# Patient Record
Sex: Male | Born: 1935 | Race: White | Hispanic: No | Marital: Married | Smoking: Never smoker
Health system: Southern US, Community
[De-identification: ages and names within clinical notes are randomized; demographics above are authoritative.]

## PROBLEM LIST (undated history)

## (undated) DIAGNOSIS — I639 Cerebral infarction, unspecified: Secondary | ICD-10-CM

## (undated) DIAGNOSIS — F329 Major depressive disorder, single episode, unspecified: Secondary | ICD-10-CM

## (undated) DIAGNOSIS — E785 Hyperlipidemia, unspecified: Secondary | ICD-10-CM

## (undated) DIAGNOSIS — F419 Anxiety disorder, unspecified: Secondary | ICD-10-CM

## (undated) DIAGNOSIS — F32A Depression, unspecified: Secondary | ICD-10-CM

## (undated) DIAGNOSIS — M8580 Other specified disorders of bone density and structure, unspecified site: Secondary | ICD-10-CM

## (undated) DIAGNOSIS — I10 Essential (primary) hypertension: Secondary | ICD-10-CM

## (undated) DIAGNOSIS — M1990 Unspecified osteoarthritis, unspecified site: Secondary | ICD-10-CM

## (undated) DIAGNOSIS — E210 Primary hyperparathyroidism: Secondary | ICD-10-CM

## (undated) DIAGNOSIS — G459 Transient cerebral ischemic attack, unspecified: Secondary | ICD-10-CM

## (undated) HISTORY — PX: TONSILLECTOMY: SUR1361

## (undated) HISTORY — DX: Essential (primary) hypertension: I10

## (undated) HISTORY — DX: Unspecified osteoarthritis, unspecified site: M19.90

## (undated) HISTORY — PX: OTHER SURGICAL HISTORY: SHX169

## (undated) HISTORY — DX: Hyperlipidemia, unspecified: E78.5

## (undated) HISTORY — PX: WRIST SURGERY: SHX841

## (undated) HISTORY — PX: JOINT REPLACEMENT: SHX530

## (undated) HISTORY — DX: Anxiety disorder, unspecified: F41.9

---

## 2001-03-31 ENCOUNTER — Encounter: Payer: Self-pay | Admitting: Orthopaedic Surgery

## 2001-03-31 ENCOUNTER — Inpatient Hospital Stay (HOSPITAL_COMMUNITY): Admission: EM | Admit: 2001-03-31 | Discharge: 2001-04-05 | Payer: Self-pay | Admitting: Emergency Medicine

## 2001-03-31 ENCOUNTER — Encounter: Payer: Self-pay | Admitting: Emergency Medicine

## 2001-04-01 ENCOUNTER — Encounter: Payer: Self-pay | Admitting: Orthopaedic Surgery

## 2001-04-02 ENCOUNTER — Encounter: Payer: Self-pay | Admitting: Orthopaedic Surgery

## 2001-04-03 ENCOUNTER — Encounter: Payer: Self-pay | Admitting: Orthopaedic Surgery

## 2001-04-04 ENCOUNTER — Encounter: Payer: Self-pay | Admitting: Orthopaedic Surgery

## 2001-04-05 ENCOUNTER — Encounter: Payer: Self-pay | Admitting: Orthopaedic Surgery

## 2008-04-29 DIAGNOSIS — C4492 Squamous cell carcinoma of skin, unspecified: Secondary | ICD-10-CM

## 2008-04-29 HISTORY — DX: Squamous cell carcinoma of skin, unspecified: C44.92

## 2009-06-25 ENCOUNTER — Inpatient Hospital Stay (HOSPITAL_COMMUNITY): Admission: EM | Admit: 2009-06-25 | Discharge: 2009-06-28 | Payer: Self-pay | Admitting: Emergency Medicine

## 2010-07-27 LAB — CBC
HCT: 40.2 % (ref 39.0–52.0)
HCT: 42 % (ref 39.0–52.0)
Hemoglobin: 14.3 g/dL (ref 13.0–17.0)
Hemoglobin: 14.6 g/dL (ref 13.0–17.0)
MCHC: 34.2 g/dL (ref 30.0–36.0)
MCHC: 34.9 g/dL (ref 30.0–36.0)
MCV: 89.2 fL (ref 78.0–100.0)
MCV: 89.4 fL (ref 78.0–100.0)
MCV: 89.7 fL (ref 78.0–100.0)
Platelets: 315 10*3/uL (ref 150–400)
RBC: 4.58 MIL/uL (ref 4.22–5.81)
WBC: 11.1 10*3/uL — ABNORMAL HIGH (ref 4.0–10.5)
WBC: 8 10*3/uL (ref 4.0–10.5)
WBC: 8.9 10*3/uL (ref 4.0–10.5)

## 2010-07-27 LAB — COMPREHENSIVE METABOLIC PANEL
ALT: 17 U/L (ref 0–53)
AST: 20 U/L (ref 0–37)
AST: 24 U/L (ref 0–37)
Albumin: 3.7 g/dL (ref 3.5–5.2)
Alkaline Phosphatase: 50 U/L (ref 39–117)
Alkaline Phosphatase: 54 U/L (ref 39–117)
BUN: 17 mg/dL (ref 6–23)
CO2: 29 mEq/L (ref 19–32)
Calcium: 10.5 mg/dL (ref 8.4–10.5)
Calcium: 9 mg/dL (ref 8.4–10.5)
Chloride: 104 mEq/L (ref 96–112)
Creatinine, Ser: 1.26 mg/dL (ref 0.4–1.5)
GFR calc Af Amer: >60 mL/min (ref >60)
GFR calc non Af Amer: >60 mL/min (ref >60)
Glucose, Bld: 101 mg/dL — ABNORMAL HIGH (ref 70–99)
Total Protein: 7.1 g/dL (ref 6.0–8.3)

## 2010-07-27 LAB — DIFFERENTIAL
Basophils Absolute: 0.1 10*3/uL (ref 0.0–0.1)
Lymphocytes Relative: 38 % (ref 12–46)
Lymphs Abs: 3 10*3/uL (ref 0.7–4.0)
Monocytes Relative: 10 % (ref 3–12)

## 2010-07-27 LAB — POCT CARDIAC MARKERS: Myoglobin, poc: 74.8 ng/mL (ref 12–200)

## 2010-07-27 LAB — PHOSPHORUS: Phosphorus: 2.8 mg/dL (ref 2.3–4.6)

## 2010-07-27 LAB — CARDIAC PANEL(CRET KIN+CKTOT+MB+TROPI)
CK, MB: 1.2 ng/mL (ref 0.3–4.0)
Relative Index: INVALID (ref 0.0–2.5)
Relative Index: INVALID (ref 0.0–2.5)
Total CK: 74 U/L (ref 7–232)

## 2010-07-27 LAB — HEMOGLOBIN AND HEMATOCRIT, BLOOD
HCT: 42.3 % (ref 39.0–52.0)
Hemoglobin: 13.4 g/dL (ref 13.0–17.0)
Hemoglobin: 14 g/dL (ref 13.0–17.0)
Hemoglobin: 14.5 g/dL (ref 13.0–17.0)

## 2010-07-27 LAB — URINALYSIS, ROUTINE W REFLEX MICROSCOPIC
Protein, ur: NEGATIVE mg/dL
Specific Gravity, Urine: 1.014 (ref 1.005–1.030)

## 2010-07-27 LAB — LIPASE, BLOOD
Lipase: 44 U/L (ref 11–59)
Lipase: 84 U/L — ABNORMAL HIGH (ref 11–59)

## 2010-07-27 LAB — TSH: TSH: 0.657 u[IU]/mL (ref 0.350–4.500)

## 2010-07-27 LAB — TROPONIN I: Troponin I: 0.01 ng/mL (ref 0.00–0.06)

## 2010-09-23 NOTE — Discharge Summary (Signed)
[facility]  Patient:    Patrick Spence, Patrick Spence Visit Number: [id number] MRN: [id number]          Service Type: MED Location: 2S [id number] Attending Physician:  Patricia Nettle Dictated by:   Dorie Rank, P.A. Admit Date:  03/31/2001                             Discharge Summary  NO REPORT. Dictated by:   Dorie Rank, P.A. Attending Physician:  Patricia Nettle. DD:  04/05/01 TD:  04/05/01 Job: [id number]

## 2010-09-23 NOTE — H&P (Signed)
[facility]  Patient:    Patrick Spence, Patrick Spence Visit Number: [id number] MRN: [id number]          Service Type: EMS Location: ED Attending Physician:  Shelba Flake Dictated by:   Ralene Bathe, P.A. Admit Date:  03/31/2001   CC:         Chales Salmon. Abigail Miyamoto, M.D.   History and Physical  CHIEF COMPLAINT:  Left wrist and left hip pain following fall.  HISTORY OF PRESENT ILLNESS:  [age]-year-old male slipped off of a ladder today around six to eight feet off the ground at his house, landing onto his left side.  He denies syncope or loss of consciousness with the episode.  He was unable to ambulate and his wife heard him call for help.  He was brought to [facility] Emergency Room, where x-rays have shown a comminuted left acetabular fracture, a left distal radius fracture and T12 compression fracture.  He denies any other head and neck, chest or abdominal pain.  He has been alert and oriented throughout transportation as well as examination in the emergency room.  PAST MEDICAL HISTORY:  Significant for hypercholesterolemia.  PAST SURGICAL HISTORY:  Tonsillectomy and adenoidectomy.  MEDICATIONS:  Zocor, unknown dose.  ALLERGIES:  No known drug allergies.  SOCIAL HISTORY:  He does not smoke.  He drinks about one alcoholic beverage a day, a glass of wine with dinner, occasionally a mixed cocktail.  He is married.  He is retired from [employer].  PRIMARY CARE PHYSICIAN:  His medical physician is Dr. Chales Salmon. Thacker with [facility].  FAMILY HISTORY:  Noncontributory.  REVIEW OF SYSTEMS:  He denies chest pain, shortness of breath or productive cough.  HEAD:  No loss of consciousness.  No visual changes.  RESPIRATORY:  No shortness of breath, productive cough or hemoptysis.  CARDIOVASCULAR:  No chest pain or orthopnea.  GI:  No nausea, vomiting, constipation, melena or bloody stools.  GU:  No dysuria or hematuria.   MUSCULOSKELETAL:  Pertinent to present illness.  PHYSICAL EXAMINATION:  GENERAL:  Well-developed, well-nourished, alert and oriented.  He is fairly comfortable surprisingly on examination.  He is comfortable as long as he is at bedrest with no movement.  NECK:  Supple.  Trachea is midline.  Neck has full range of motion.  He is nontender.  HEENT:  His head is normocephalic, nontender.  Extraocular motions are intact.  CHEST:  Clear to auscultation bilaterally.  No rales or rhonchi.  HEART:  Regular rate and rhythm.  No murmurs, gallops or rubs.  ABDOMEN:  Soft, nontender.  Positive bowel sounds in all quadrants.  He is nontender at the pubis.  EXTREMITIES:  His shoulders have full range of motion, elbows full.  His wrist on the left has obvious deformity.  He is neurovascularly intact to bilateral upper extremities.  Lower extremity evaluation shows positive pulses, neurovascularly intact grossly to both lower extremities.  He has pain with any range of motion to the left lower extremity.  Good pulses.  GU:  A Foley that was placed has no obvious hematuria.  X-RAY FINDINGS: 1. Left severely T-type comminuted displaced acetabular fracture with    protrusio. 2. Displaced left distal radius fracture. 3. Less-than-or-equal-to 10% T12 compression fracture.  IMPRESSION: 1. Left severely T-type comminuted displaced acetabular fracture with    protrusio. 2. Displaced left distal radius fracture. 3. Less-than-or-equal-to 10% T12 compression fracture.  PROCEDURES: 1. In the emergency room, patient had a hematoma block and  Dr. Patricia Nettle    reduced the left distal radius fracture with good result.  Post-reduction    films showed improved alignment. 2. Under local anesthetic and IV sedation, application of a left femoral    traction pin was placed for skeletal traction.  Patient tolerated procedure    well.  PLAN:  Overall plan will be for around three weeks of traction  followed by continued bed-to-chair transfers once the traction pin is removed.  He will probably need overall around eight weeks to three months for healing to assess his clinical symptoms in the left acetabulum.  If he is clinically symptomatic, may require a primary total hip arthroplasty at that time.  We will discuss his x-rays with Dr. Onalee Hua III.  He will probably require open reduction and internal fixation or application of an external fixator of the left distal radius.  I have also discussed his admission with Dr. Nolon Rod Monguilod of [facility] hospitalists.  He will see and follow along with [place].  We will initially evaluate him step-down unit and watch for complications.  We will PAS hose to the lower extremities. After no significant risk of bleeding complications have been determined, will need some DVT and PE prophylaxis probably in the way of Lovenox and a short course of Coumadin possibly.  Will discuss with Dr. Jamie Brookes and Dr. Noel Gerold. Dictated by:   Ralene Bathe, P.A. Attending Physician:  Shelba Flake DD:  03/31/01 TD:  04/01/01 Job: [id number]

## 2010-09-23 NOTE — Op Note (Signed)
[facility]  Patient:    Patrick Spence, Patrick Spence Visit Number: [id number] MRN: [id number]          Service Type: MED Location: 2S 4782 01 Attending Physician:  Patricia Nettle. Admit Date:  03/31/2001                             Operative Report  PREOPERATIVE DIAGNOSES: 1. Left displaced comminuted T type acetabular fracture with    protrusio. 2. left displaced distal radius fracture. 3. T12 decompression fracture.  POSTOPERATIVE DIAGNOSES: 1. Left displaced comminuted T type acetabular fracture with    protrusio. 2. Left displaced distal radius fracture. 3. T12 decompression fracture.  PROCEDURE PERFORMED: 1. Closed reduction and hematoma block left displaced distal    radius fracture. 2. Insertion of left distal femoral traction pin. 3. Setup and placement of left distal femoral skeletal traction.  SURGEON:  Patricia Nettle, M.D.  INDICATION:  The patient is a 77 year old male who fell six feet off a ladder this afternoon.  He is brought to the [facility]. Diagnoses of the above were made.  The left distal radius fracture was closed reduced under a hematoma block and a sugar tong splint placed.  Post reduction x-rays showed acceptable alignment.  A left distal femoral traction pin was placed.  The distal femur was prepped and draped in the usual sterile fashion. A 1 cm incision was made after a local anesthetic was infiltrated into the skin and the periosteum both medial and laterally.  An incision, 1 cm was made at the medial aspect 3 cm above the medial epicondyle.  Using a power drill, a large straight Steinmann pin was inserted parallel to the knee joint perpendicular to the femoral shaft.  The pin sites were dressed using Xeroform.  A traction bow was applied to this.  Distal femoral traction was setup on an orthopedic bed using two slings to suspend the leg and in light traction from the bow.  A total of 20 pounds of  traction was initially hung and a post traction x-ray will be obtained, and adjustments made from their.  DISPOSITION:  The patient is going to be admitted to the step-down unit for close pulmonary monitoring.  A medical consult was obtained.  DVT prophylaxis will be initiated.  Aggressive pulmonary toilet will be maintained. Attending Physician:  Patricia Nettle. DD:  03/31/01 TD:  04/01/01 Job: [id number]

## 2010-09-23 NOTE — Discharge Summary (Signed)
[facility]  Patient:    Patrick Spence, Patrick Spence Visit Number: [id number] MRN: [id number]          Service Type: MED Location: 2S [phone] Attending Physician:  Patricia Nettle Dictated by:   Dorie Rank, P.A.-C. Admit Date:  03/31/2001 Disc. Date: 04/05/01                             Discharge Summary  ADMITTING DIAGNOSES: 1. Left severely T-type comminuted displaced acetabular fracture with    protrusio. 2. Displaced left distal radius fracture. 3. Less than or equal to 10% T-12 compression fracture. 4. Hyperlipidemia. 5. Osteoarthritis. 6. Hard of hearing.  DISCHARGE DIAGNOSES: 1. Left severely T-type comminuted displaced acetabular fracture with    protrusion. 2. Reduced left distal radius fracture. 3. Less than or equal to 10% T-12 compression fracture. 4. Hyperlipidemia. 5. Osteoarthritis. 6. Hard of hearing.  HISTORY OF PRESENT ILLNESS:  Mr. Foiles is a pleasant 77 year old male, who flipped off a ladder on 03/31/01, which was approximately 6-8 feet off the ground at his house.  He landed on his left side.  At that time, he denied any syncope or loss of consciousness.  He was unable to ambulate, and his wife heard him call for help.  He was brought to [facility] Emergency Room where radiographs revealed a comminuted left acetabular fracture and a left distal radius fracture, and a T-12 compression fracture.  He denied any other head or neck, chest or abdominal pain symptoms.  He was alert and oriented throughout his transportation to the emergency room.  It was felt he would benefit from undergoing admission for stabilization and pain control of his fractures.  CONSULTS:  Dr. Jamie Brookes of [facility].  PROCEDURES: 1. Left distal radius reduction under hematoma block in the emergency    department. 2. Application of left femoral traction pin for skeletal traction to the left    acetabular fracture.  The surgeon was Dr. Sharolyn Douglas.   Complications none.  HOSPITAL COURSE:  The patient was admitted on the above-stated date, and the above procedures were performed without difficulty.  A medicine consult was obtained - Dr. Jamie Brookes for medical management in the hospital.  He did have some hypertension that was felt to be situational and was resolved by 04/02/01.  He was noted to have some mild anemia while in the hospital.  No transfusions were needed.  It was felt that it was due to his multiple fracture, and no obvious bleeds were noted.  He was placed in distal femur skeletal traction.  His left wrist was kept elevated and finger range of motion was encouraged for edema control.  Ice packs were placed to the left wrist and hips.  An IV was started and kept at in cava while in the hospital. He used PlexiPulse to bilateral feet for DVT prophylaxis.  He was started on Lovenox for DVT prophylaxis as well.  Initially, he was started on a clear liquid diet and was advanced to regular by 04/01/01.  He was tolerating p.o. intake throughout his hospital stay; however, was having some nausea with food.  He did have a BM while in the hospital.  He did have some constipation. He had good bowel sounds, and his abdomen was soft throughout his hospital stay.  He was placed on head rest with the head of the bed to 30 degrees maximum.  A Foley catheter was placed.  No  blood was noted in his urine.  He utilized Microbiologist and Robaxin for pain control, Reglan and Phenergan for nausea.  He utilized an Oncologist.  X-rays were obtained daily of his pelvis and found to be unchanged.  He did have some itching while in the hospital and after changed to hypoallergenic sheets, this did resolve.  Dr. Noel Gerold conferred with Dr. Hyman Hopes, and it was felt that he would benefit from undergoing transfer and evaluation by Dr. Hyman Hopes for his acetabular fracture. Discharge planning worked on the arrangements.  On 04/05/01, he was felt to be medically  and orthopedically stable for transfer to [facility].  CONDITION ON DISCHARGE:  Stable.  LABORATORY VALUES:  H&H on 03/31/01, 13.1 and 38.6; on 04/02/01, hemoglobin 10.8, hematocrit 31.0; on 04/03/01, hemoglobin 10.5, hematocrit 30.2. Coagulation studies on 03/31/01 within normal limits.  BMET on 03/31/01 were within normal limits other than a glucose of 151.  Liver function studies on 03/31/01 with a normal amylase and ALP of 34.  PSA on 04/01/01 was 1.50.  UA on 03/31/01 was within normal limits other than ketones.  EKG on 03/31/01 revealed normal sinus rhythm, was unconfirmed.  Serial radiographs performed daily in the hospital showed a widely distracted left acetabular fracture with no change.  Postreduction of left wrist fracture on 03/31/01, films revealed distal radius fracture now near anatomic alignment in a fiberglass cast.  Left knee x-rays on 03/31/01 revealed a transverse pinned and distal femoral impaction metadiaphysis.  Thoracic x-rays from 03/31/01 revealed a T-12 compression fracture less than 10%.  CT lumbar spine reconstructions revealed mild vertebral body compression fracture at T-12; no evidence of bone, retropulsion, or compromise of the spinal canal.  DISCHARGE PLANS AND MEDICATIONS:  The patient will be transferred to [facility] under the care of Dr. Christene Slates.  ACTIVITY:  Bed rest, head of bed no greater than 30 degrees, skeletal traction to the left lower extremity PlexiPulse of foot to pumps.  DISCHARGE MEDICATIONS:  1. Protonix 40 mg 1 p.o. q.d. with meals.  2. Colace 100 mg 1 p.o. b.i.d.  3. Lovenox 30 mg subcu q.12h.  4. Percocet 5 mg 1-2 p.o. q.4-6h. p.r.n. pain.  5. Robaxin 500 mg 1 p.o. q.8h. p.r.n. spasm.  6. Phenergan 12.5 IV/25 mg p.o. q.4-6h. p.r.n. nausea.  7. Reglan 10 mg 1 p.o. q.8h. p.r.n. nausea.  8. Tylenol 650 1 p.o. q.4h. p.r.n. fever.  9. Restoril 15 mg 1 p.o. q.h.s. p.r.n. 10. Dilaudid 2-4 mg IV q.4h. p.r.n. severe pain  only. 11. Benadryl 25-50 mg p.o. q.6-8h. p.r.n. itching. 12. Laxative of choice, enema of choice p.r.n. constipation.   He will need his radiographs as well as all of the reports to be transferred with him and continue his incentive spirometry.  DIET:  Regular supplemented with Ensure.  PIN CARE SITES:  Peroxide. Dictated by:   Dorie Rank, P.A.-C. Attending Physician:  Patricia Nettle. DD:  04/05/01 TD:  04/05/01 Job: [id number]

## 2010-09-23 NOTE — Procedures (Signed)
[facility]  Patient:    Patrick Spence, Patrick Spence                     MRN: [id number] Proc. Date: 11/22/99 Adm. Date:  [id number] Disc. Date: [id number] Attending:  Louie Bun CC:         Chales Salmon. Abigail Miyamoto, M.D.                           Procedure Report  PROCEDURE:  Colonoscopy.  INDICATION FOR PROCEDURE:  Family history of colon cancer in a first-degree relative.  DESCRIPTION OF PROCEDURE:  The patient was placed in the left lateral decubitus position and placed on the pulse monitor with continuous low-flow oxygen delivered by nasal cannula.  He was sedated with 100 mg IV Demerol and 10 mg IV Versed.  The Olympus video colonoscope was inserted into the rectum and advanced to the cecum, confirmed by transillumination at McBurneys point and visualization of the ileocecal valve and appendiceal orifice.  The prep was excellent.  The cecum, ascending, transverse, descending, and sigmoid colon all appeared normal with no masses, polyps, diverticula, or other mucosal abnormalities.  The rectum likewise appeared normal, and retroflex view of the anus did reveal some small internal hemorrhoids.  The colonoscope was then withdrawn and the patient returned to the recovery room in stable condition.  He tolerated the procedure well, and there were no immediate complications.  IMPRESSION:  Internal hemorrhoids, otherwise normal colonoscopy.  PLAN:  Repeat colonoscopy in five years. DD:  11/22/99 TD:  11/23/99 Job: [id number]

## 2010-11-04 ENCOUNTER — Emergency Department (HOSPITAL_COMMUNITY)
Admission: EM | Admit: 2010-11-04 | Discharge: 2010-11-04 | Disposition: A | Discharge status: Home / Self Care | Payer: Medicare Other | Attending: Emergency Medicine | Admitting: Emergency Medicine

## 2010-11-04 DIAGNOSIS — E7800 Pure hypercholesterolemia, unspecified: Secondary | ICD-10-CM | POA: Insufficient documentation

## 2010-11-04 DIAGNOSIS — R0789 Other chest pain: Secondary | ICD-10-CM | POA: Insufficient documentation

## 2010-11-04 DIAGNOSIS — I10 Essential (primary) hypertension: Secondary | ICD-10-CM | POA: Insufficient documentation

## 2010-11-04 LAB — COMPREHENSIVE METABOLIC PANEL
ALT: 14 U/L (ref 0–53)
AST: 22 U/L (ref 0–37)
Alkaline Phosphatase: 42 U/L (ref 39–117)
CO2: 26 mEq/L (ref 19–32)
Chloride: 103 mEq/L (ref 96–112)
GFR calc Af Amer: >60 mL/min (ref >60)
GFR calc non Af Amer: >60 mL/min (ref >60)
Glucose, Bld: 104 mg/dL — ABNORMAL HIGH (ref 70–99)
Potassium: 3.2 mEq/L — ABNORMAL LOW (ref 3.5–5.1)
Sodium: 135 mEq/L (ref 135–145)
Total Bilirubin: 0.4 mg/dL (ref 0.3–1.2)

## 2010-11-04 LAB — DIFFERENTIAL
Basophils Relative: 1 % (ref 0–1)
Lymphs Abs: 1.7 10*3/uL (ref 0.7–4.0)
Monocytes Absolute: 0.7 10*3/uL (ref 0.1–1.0)
Monocytes Relative: 14 % — ABNORMAL HIGH (ref 3–12)
Neutro Abs: 2.1 10*3/uL (ref 1.7–7.7)
Neutrophils Relative %: 41 % — ABNORMAL LOW (ref 43–77)

## 2010-11-04 LAB — CBC
Hemoglobin: 13.3 g/dL (ref 13.0–17.0)
MCH: 29.9 pg (ref 26.0–34.0)
MCHC: 33.6 g/dL (ref 30.0–36.0)
MCV: 89 fL (ref 78.0–100.0)
RBC: 4.45 MIL/uL (ref 4.22–5.81)

## 2010-11-04 LAB — CK TOTAL AND CKMB (NOT AT ARMC): Total CK: 113 U/L (ref 7–232)

## 2010-11-04 LAB — TROPONIN I: Troponin I: <0.3 ng/mL (ref <0.30)

## 2012-06-18 ENCOUNTER — Encounter (INDEPENDENT_AMBULATORY_CARE_PROVIDER_SITE_OTHER): Payer: Self-pay

## 2012-06-25 ENCOUNTER — Encounter (INDEPENDENT_AMBULATORY_CARE_PROVIDER_SITE_OTHER): Payer: Self-pay | Admitting: Surgery

## 2012-06-25 ENCOUNTER — Ambulatory Visit (INDEPENDENT_AMBULATORY_CARE_PROVIDER_SITE_OTHER): Payer: Medicare Other | Admitting: Surgery

## 2012-06-26 ENCOUNTER — Encounter (INDEPENDENT_AMBULATORY_CARE_PROVIDER_SITE_OTHER): Payer: Self-pay | Admitting: Surgery

## 2012-06-26 ENCOUNTER — Ambulatory Visit (INDEPENDENT_AMBULATORY_CARE_PROVIDER_SITE_OTHER): Payer: Medicare Other | Admitting: Surgery

## 2012-06-26 NOTE — Patient Instructions (Signed)
Parathyroidectomy A parathyroidectomy is surgery to remove one or more parathyroid glands. These glands produce a hormone (parathyroid hormone) that helps control the level of calcium in your body. The glands are very small, about the size of a pea. They are located in your neck, close to your thyroid gland and your Adam's apple. Most people (85%) have four parathyroid glands,some people may have one or two more than that. Hyperparathyroidism is when too much parathyroid hormone is being produced. Usually this is caused by one of the parathyroid glands becoming enlarged, but it can also be caused by more than one of the glands. Hyperparathyroidism is found during blood tests that show high calcium in the blood. Parathyroid hormone levels will also be elevated. Cancer also can cause hyperparathyroidism, but this is rare. For the most common type of hyperparathyroidism, the treatment is surgical removal of the parathyroid gland that is enlarged. For patients with kidney failure and hyperparathyroidism, other treatment will be tried before surgery is done on the parathyroid.  Many times x-ray studies are done to find out which parathyroid gland or glands is malfunctioning. The decision about the best treatment for hyperparathyroidism is between the patient, their primary doctor, an endocrinologist, and a surgeon experienced in parathyroid surgery. LET YOUR CAREGIVER KNOW ABOUT:  Any allergies.  All medications you are taking, including:  Herbs, eyedrops, over-the-counter medications and creams.  Blood thinners (anticoagulants), aspirin or other drugs that could affect blood clotting.  Use of steroids (by mouth or as creams).  Previous problems with anesthetics, including local anesthetics.  Possibility of pregnancy, if this applies.  Any history of blood clots.  Any history of bleeding or other blood problems.  Previous surgery.  Smoking history.  Other health problems. RISKS AND  COMPLICATIONS   Short-term possibilities include:  Excessive bleeding.  Pain.  Infection near the incision.  Slow healing.  Pooling of blood under the wound (hematoma).  Damage to nerves in your neck.  Blood clots.  Difficulty breathing. This is very rare. It also is almost always temporary.  Longer-term possibilities include:  Scarring.  Skin damage.  Damage to blood vessels in the area.  Need for additional surgery.  A hoarse or weak voice. This is usually temporary. It can be the result of nerve damage.  Development of hypoparathyroidism. This means you are not making enough parathyroid hormone. It is rare. If it occurs, you will need to take calcium supplements daily. BEFORE THE PROCEDURE  Sometimes the surgery is done on an outpatient basis. This means you could go home the same day as your surgery. Other times, people need to stay in the hospital overnight. Ask your surgeon what you should expect.  If your surgery will be an outpatient procedure, arrange for someone to drive you home after the surgery.  Two weeks before your surgery, stop using aspirin and non-steroidal anti-inflammatory drugs (NSAID's) for pain relief. This includes prescription drugs and over-the-counter drugs such as ibuprofen and naproxen. Also stop taking vitamin E.  If you take blood-thinners, ask your healthcare provider when you should stop taking them.  Do not eat or drink for about 8 hours before your surgery.  You might be asked to shower or wash with a special antibacterial soap before the procedure.  Arrive at least an hour before the surgery, or whenever your surgeon recommends. This will give you time to check in and fill out any needed paperwork. PROCEDURE  The preparation:  You will change into a hospital gown.  You   will be given an IV. A needle will be inserted in your arm. Medication will be able to flow directly into your body through this needle.  You might be given a  sedative to help you relax.  You will be given a drug that puts you to sleep during the surgery (general anesthetic).  The procedure:  Once you are asleep, the surgeon will make a small cut (incision) in your lower neck. Ask your surgeon where the incision will be.  The surgeon will look for the gland(s) that are not working well. Often a tissue sample from a gland is used to determine this.  Any glands that are not working well will be removed.  The surgeon will close the incision with stitches, often these are hidden under the skin. AFTER THE PROCEDURE  You will stay in a recovery area until the anesthesia has worn off. Your blood pressure and heart rate will be checked.  If your surgery was an outpatient procedure, you will go home the same day.  If you need to stay in the hospital, you will be moved to a hospital room. You will probably stay for two to three days. This will depend on how quickly you recover.  While you are in the hospital, your blood will be tested to check the calcium levels in your body. HOME CARE INSTRUCTIONS   Take any medication that your surgeon prescribes. Follow the directions carefully. Take all of the medication.  Ask your surgeon whether you can take over-the-counter medicines for pain, discomfort or fever. Do not take aspirin unless your healthcare provider says to. Aspirin increases the chances of bleeding.  Do not get the wound wet for the first few days after surgery (or until the surgeon tells you it is OK). SEEK MEDICAL CARE IF:   You notice blood or fluid leaking from the wound, or it becomes red or swollen.  You have trouble breathing.  You have trouble speaking.  You become nauseous or throw up for more than two days after the surgery.  You develop a fever of more than 100.5 F (38.1 C). SEEK IMMEDIATE MEDICAL CARE IF:   Breathing becomes more difficult.  You develop a fever of 102.0 F (38.9 C) or higher. Document Released:  07/21/2008 Document Revised: 07/17/2011 Document Reviewed: 07/21/2008 Exit[employer] Patient Information 2013 [employer].  

## 2012-06-26 NOTE — Progress Notes (Signed)
General Surgery [facility]  Chief Complaint  Patient presents with  . New Evaluation    eval hyperparathyroidism - referral from Dr. Sigmund Hazel, [email or web address]  [place] office    HISTORY: Patient is a 79 year old white male referred by his primary care physician for evaluation of hypercalcemia. Patient has a history of osteopenia. He had a significant fracture of his pelvis and left wrist 10 years ago. He has been recently noted on laboratory studies to have elevated serum calcium levels ranging from 11.3-11.5. Ionized calcium levels were elevated at 6.1-6.3. A 24-hour urine collection for calcium was elevated at 375. Intact PTH levels have ranged from 25-48. Patient is now referred for evaluation for suspected primary hyperparathyroidism.  Patient has no prior history of endocrine disease. He denies nephrolithiasis. He has had no prior surgery on the neck. There is no family history of endocrine disease.  Past Medical History  Diagnosis Date  . Arthritis   . HLD (hyperlipidemia)   . Stroke   . Hypertension   . Anxiety   . Osteoporosis      Current Outpatient Prescriptions  Medication Sig Dispense Refill  . alendronate (FOSAMAX) 70 MG tablet Take 70 mg by mouth every 7 (seven) days. Take with a full glass of water on an empty stomach.      Marland Kitchen aspirin 325 MG buffered tablet Take 325 mg by mouth daily.      . calcium-vitamin D (OSCAL WITH D) 500-200 MG-UNIT per tablet Take 1 tablet by mouth daily.      . fish oil-omega-3 fatty acids 1000 MG capsule Take 2 g by mouth daily.      Marland Kitchen glucosamine-chondroitin 500-400 MG tablet Take 1 tablet by mouth 3 (three) times daily.      Marland Kitchen losartan (COZAAR) 50 MG tablet Take 50 mg by mouth daily.      . Multiple Vitamins-Minerals (CENTRUM SILVER ADULT 50+ PO) Take by mouth.      . niacin 500 MG CR capsule Take 500 mg by mouth at bedtime.      . simvastatin (ZOCOR) 10 MG tablet Take 10 mg by mouth at bedtime.       No current  facility-administered medications for this visit.     No Known Allergies   Family History  Problem Relation Age of Onset  . Cancer Father     stomach  . Cancer Mother     lung     History   Social History  . Marital Status: Married    Spouse Name: N/A    Number of Children: N/A  . Years of Education: N/A   Social History Main Topics  . Smoking status: Never Smoker   . Smokeless tobacco: Never Used  . Alcohol Use: Yes  . Drug Use: No  . Sexually Active: None   Other Topics Concern  . None   Social History Narrative  . None     REVIEW OF SYSTEMS - PERTINENT POSITIVES ONLY: Denies tremors. Denies palpitations. Denies fatigue. Denies dysphagia.  EXAM: Filed Vitals:   06/26/12 0933  BP: 138/82  Pulse: 64  Temp: 97.5 F (36.4 C)  Resp: 20    HEENT: normocephalic; pupils equal and reactive; sclerae clear; dentition good; mucous membranes moist NECK:  No palpable nodules in the thyroid bed; symmetric on extension; no palpable anterior or posterior cervical lymphadenopathy; no supraclavicular masses; no tenderness CHEST: clear to auscultation bilaterally without rales, rhonchi, or wheezes CARDIAC: regular rate and rhythm without significant murmur;  peripheral pulses are full EXT:  non-tender without edema; no deformity NEURO: no gross focal deficits; no sign of tremor   LABORATORY RESULTS: See [facility] (CHL-Epic) for most recent results   RADIOLOGY RESULTS: See [facility] (CHL-Epic) for most recent results   IMPRESSION: #1 hypercalcemia #2 suspected primary hyperparathyroidism #3 osteopenia #4 hypertension  PLAN: I had a lengthy discussion with the patient and his wife. I suspect that he does have primary hyperparathyroidism. I would like to check a 25 hydroxy vitamin D level and we will repeat his intact PTH level in a different laboratory. Patient will be scheduled for a nuclear medicine parathyroid scan in the near future. Once these  results are available for review, I will contact the patient.  Velora Heckler, MD, FACS General & Endocrine Surgery [facility]   Visit Diagnoses: 1. Hypercalcemia     Primary Care Physician: Neldon Labella, MD

## 2012-07-01 ENCOUNTER — Telehealth (INDEPENDENT_AMBULATORY_CARE_PROVIDER_SITE_OTHER): Payer: Self-pay

## 2012-07-01 NOTE — Telephone Encounter (Signed)
Labs to Dr Gerrit Friends for review and to be scanned into epic.

## 2012-07-05 ENCOUNTER — Encounter (HOSPITAL_COMMUNITY): Payer: Self-pay

## 2012-07-05 ENCOUNTER — Encounter (HOSPITAL_COMMUNITY): Payer: Medicare Other

## 2012-07-05 ENCOUNTER — Encounter (HOSPITAL_COMMUNITY)
Admission: RE | Admit: 2012-07-05 | Discharge: 2012-07-05 | Disposition: A | Discharge status: Home / Self Care | Payer: Medicare Other | Source: Ambulatory Visit | Attending: Surgery | Admitting: Surgery

## 2012-07-05 ENCOUNTER — Encounter (HOSPITAL_COMMUNITY): Admission: RE | Admit: 2012-07-05 | Payer: Medicare Other | Source: Ambulatory Visit

## 2012-07-05 DIAGNOSIS — E213 Hyperparathyroidism, unspecified: Secondary | ICD-10-CM | POA: Insufficient documentation

## 2012-07-05 MED ORDER — TECHNETIUM TC 99M SESTAMIBI - CARDIOLITE
25.0000 | Freq: Once | INTRAVENOUS | Status: AC | PRN
  Administered 2012-07-05: 25 via INTRAVENOUS

## 2012-07-10 ENCOUNTER — Telehealth (INDEPENDENT_AMBULATORY_CARE_PROVIDER_SITE_OTHER): Payer: Self-pay | Admitting: General Surgery

## 2012-07-10 NOTE — Telephone Encounter (Signed)
Pt called to ask about the results of his scan and Dr. Ardine Eng "next step" for him.  Explained Dr. Gerrit Friends needs to review the images and he will be notified.

## 2012-07-18 ENCOUNTER — Other Ambulatory Visit (INDEPENDENT_AMBULATORY_CARE_PROVIDER_SITE_OTHER): Payer: Self-pay

## 2012-07-18 ENCOUNTER — Telehealth (INDEPENDENT_AMBULATORY_CARE_PROVIDER_SITE_OTHER): Payer: Self-pay | Admitting: Surgery

## 2012-07-18 ENCOUNTER — Telehealth (INDEPENDENT_AMBULATORY_CARE_PROVIDER_SITE_OTHER): Payer: Self-pay | Admitting: *Deleted

## 2012-07-18 ENCOUNTER — Other Ambulatory Visit (INDEPENDENT_AMBULATORY_CARE_PROVIDER_SITE_OTHER): Payer: Self-pay | Admitting: Surgery

## 2012-07-18 DIAGNOSIS — D351 Benign neoplasm of parathyroid gland: Secondary | ICD-10-CM

## 2012-07-18 DIAGNOSIS — E210 Primary hyperparathyroidism: Secondary | ICD-10-CM | POA: Insufficient documentation

## 2012-07-18 NOTE — Telephone Encounter (Signed)
Called patient with results.  Sestamibi is negative for adenoma.  Vit D is normal.  PTH is upper normal, but definitely not suppressed.  After discussion, will proceed with MRI scan of neck in hopes of identifying adenoma and allowing for minimally invasive surgery.  Velora Heckler, MD, [facility] Office: [phone]

## 2012-07-18 NOTE — Telephone Encounter (Signed)
Patient called to verify that we have all results back and is hoping to speak with Dr. Gerrit Friends regarding the plan forward.  This RN was able to call [facility] and received the PTH, intact and Vitamin D.

## 2012-07-25 ENCOUNTER — Ambulatory Visit
Admission: RE | Admit: 2012-07-25 | Discharge: 2012-07-25 | Disposition: A | Discharge status: Home / Self Care | Payer: Medicare Other | Source: Ambulatory Visit | Attending: Surgery | Admitting: Surgery

## 2012-07-25 MED ORDER — GADOBENATE DIMEGLUMINE 529 MG/ML IV SOLN
17.0000 mL | Freq: Once | INTRAVENOUS | Status: AC | PRN
  Administered 2012-07-25: 17 mL via INTRAVENOUS

## 2012-07-29 ENCOUNTER — Encounter (INDEPENDENT_AMBULATORY_CARE_PROVIDER_SITE_OTHER): Payer: Self-pay

## 2012-08-01 ENCOUNTER — Other Ambulatory Visit (INDEPENDENT_AMBULATORY_CARE_PROVIDER_SITE_OTHER): Payer: Self-pay | Admitting: Surgery

## 2012-08-01 ENCOUNTER — Telehealth (INDEPENDENT_AMBULATORY_CARE_PROVIDER_SITE_OTHER): Payer: Self-pay

## 2012-08-01 ENCOUNTER — Telehealth (INDEPENDENT_AMBULATORY_CARE_PROVIDER_SITE_OTHER): Payer: Self-pay | Admitting: Surgery

## 2012-08-01 DIAGNOSIS — E210 Primary hyperparathyroidism: Secondary | ICD-10-CM

## 2012-08-01 NOTE — Telephone Encounter (Signed)
Called results of negative MRI.  I have recommended proceeding with neck exploration and parathyroidectomy.  Patient agrees and we will schedule his surgery at [facility] in the near future.  The risks and benefits of the procedure have been discussed at length with the patient.  The patient understands the proposed procedure, potential alternative treatments, and the course of recovery to be expected.  All of the patient's questions have been answered at this time.  The patient wishes to proceed with surgery.  Velora Heckler, MD, [facility] Office: [phone]

## 2012-08-01 NOTE — Telephone Encounter (Signed)
I returned pts call. Pt given MRI result and advised the report is forwarded to Dr Gerrit Friends to review and advise any treatment plan or f/u.

## 2012-08-01 NOTE — Telephone Encounter (Signed)
Surgery scheduling form to surgery schedulers per Dr Ardine Eng request.

## 2012-08-07 ENCOUNTER — Encounter (HOSPITAL_COMMUNITY): Payer: Self-pay | Admitting: Pharmacy Technician

## 2012-08-12 ENCOUNTER — Encounter (HOSPITAL_COMMUNITY): Payer: Self-pay

## 2012-08-12 ENCOUNTER — Encounter (HOSPITAL_COMMUNITY)
Admission: RE | Admit: 2012-08-12 | Discharge: 2012-08-12 | Disposition: A | Discharge status: Home / Self Care | Payer: Medicare Other | Source: Ambulatory Visit | Attending: Surgery | Admitting: Surgery

## 2012-08-12 HISTORY — DX: Transient cerebral ischemic attack, unspecified: G45.9

## 2012-08-12 HISTORY — DX: Depression, unspecified: F32.A

## 2012-08-12 HISTORY — DX: Cerebral infarction, unspecified: I63.9

## 2012-08-12 HISTORY — DX: Primary hyperparathyroidism: E21.0

## 2012-08-12 HISTORY — DX: Other specified disorders of bone density and structure, unspecified site: M85.80

## 2012-08-12 HISTORY — DX: Major depressive disorder, single episode, unspecified: F32.9

## 2012-08-12 LAB — CBC
HCT: 39.9 % (ref 39.0–52.0)
MCH: 31 pg (ref 26.0–34.0)
MCV: 90.3 fL (ref 78.0–100.0)
Platelets: 233 10*3/uL (ref 150–400)
RBC: 4.42 MIL/uL (ref 4.22–5.81)

## 2012-08-12 LAB — BASIC METABOLIC PANEL
BUN: 20 mg/dL (ref 6–23)
CO2: 27 mEq/L (ref 19–32)
Calcium: 10.4 mg/dL (ref 8.4–10.5)
Chloride: 105 mEq/L (ref 96–112)
Creatinine, Ser: 1.01 mg/dL (ref 0.50–1.35)
Glucose, Bld: 98 mg/dL (ref 70–99)

## 2012-08-12 NOTE — Patient Instructions (Addendum)
Semaje Kinker Chimento  08/12/2012                           YOUR PROCEDURE IS SCHEDULED ON: 08/19/12               PLEASE REPORT TO SHORT STAY CENTER AT :  7:00 AM               CALL THIS NUMBER IF ANY PROBLEMS THE DAY OF SURGERY :               [phone]                      REMEMBER:   Do not eat food or drink liquids AFTER MIDNIGHT   Take these medicines the morning of surgery with A SIP OF WATER:  NONE   Do not wear jewelry, make-up   Do not wear lotions, powders, or perfumes.   Do not shave legs or underarms 12 hrs. before surgery (men may shave face)  Do not bring valuables to the hospital.  Contacts, dentures or bridgework may not be worn into surgery.  Leave suitcase in the car. After surgery it may be brought to your room.  For patients admitted to the hospital more than one night, checkout time is 11:00                          The day of discharge.   Patients discharged the day of surgery will not be allowed to drive home                             If going home same day of surgery, must have someone stay with you first                           24 hrs at home and arrange for some one to drive you home from hospital.    Special Instructions:   Please read over the following fact sheets that you were given:               1. MRSA  INFORMATION                      2. [place] PREPARING FOR SURGERY SHEET               3. STOP ASPIRIN AND HERBAL MEDS 5 DAYS PREOP                                                X_____________________________________________________________________        Failure to follow these instructions may result in cancellation of your surgery

## 2012-08-13 ENCOUNTER — Encounter (INDEPENDENT_AMBULATORY_CARE_PROVIDER_SITE_OTHER): Payer: Self-pay

## 2012-08-19 ENCOUNTER — Encounter (HOSPITAL_COMMUNITY): Payer: Self-pay | Admitting: Anesthesiology

## 2012-08-19 ENCOUNTER — Encounter (HOSPITAL_COMMUNITY)
Admission: RE | Disposition: A | Discharge status: Home / Self Care | Payer: Self-pay | Source: Ambulatory Visit | Attending: Surgery

## 2012-08-19 ENCOUNTER — Encounter (HOSPITAL_COMMUNITY): Payer: Self-pay | Admitting: *Deleted

## 2012-08-19 ENCOUNTER — Observation Stay (HOSPITAL_COMMUNITY)
Admission: RE | Admit: 2012-08-19 | Discharge: 2012-08-20 | Disposition: A | Discharge status: Home / Self Care | Payer: Medicare Other | Source: Ambulatory Visit | Attending: Surgery | Admitting: Surgery

## 2012-08-19 ENCOUNTER — Ambulatory Visit (HOSPITAL_COMMUNITY): Payer: Medicare Other | Admitting: Anesthesiology

## 2012-08-19 DIAGNOSIS — Z8673 Personal history of transient ischemic attack (TIA), and cerebral infarction without residual deficits: Secondary | ICD-10-CM | POA: Insufficient documentation

## 2012-08-19 DIAGNOSIS — F411 Generalized anxiety disorder: Secondary | ICD-10-CM | POA: Insufficient documentation

## 2012-08-19 DIAGNOSIS — D351 Benign neoplasm of parathyroid gland: Principal | ICD-10-CM | POA: Insufficient documentation

## 2012-08-19 DIAGNOSIS — E785 Hyperlipidemia, unspecified: Secondary | ICD-10-CM | POA: Insufficient documentation

## 2012-08-19 DIAGNOSIS — Z01810 Encounter for preprocedural cardiovascular examination: Secondary | ICD-10-CM | POA: Insufficient documentation

## 2012-08-19 DIAGNOSIS — E210 Primary hyperparathyroidism: Secondary | ICD-10-CM

## 2012-08-19 DIAGNOSIS — I10 Essential (primary) hypertension: Secondary | ICD-10-CM | POA: Insufficient documentation

## 2012-08-19 DIAGNOSIS — F3289 Other specified depressive episodes: Secondary | ICD-10-CM | POA: Insufficient documentation

## 2012-08-19 DIAGNOSIS — Z01812 Encounter for preprocedural laboratory examination: Secondary | ICD-10-CM | POA: Insufficient documentation

## 2012-08-19 DIAGNOSIS — M949 Disorder of cartilage, unspecified: Secondary | ICD-10-CM | POA: Insufficient documentation

## 2012-08-19 DIAGNOSIS — Z79899 Other long term (current) drug therapy: Secondary | ICD-10-CM | POA: Insufficient documentation

## 2012-08-19 DIAGNOSIS — M899 Disorder of bone, unspecified: Secondary | ICD-10-CM | POA: Insufficient documentation

## 2012-08-19 DIAGNOSIS — F329 Major depressive disorder, single episode, unspecified: Secondary | ICD-10-CM | POA: Insufficient documentation

## 2012-08-19 HISTORY — PX: PARATHYROIDECTOMY: SHX19

## 2012-08-19 LAB — BASIC METABOLIC PANEL
BUN: 18 mg/dL (ref 6–23)
Chloride: 104 mEq/L (ref 96–112)
Creatinine, Ser: 1.06 mg/dL (ref 0.50–1.35)
GFR calc Af Amer: 76 mL/min — ABNORMAL LOW (ref >90)
GFR calc non Af Amer: 66 mL/min — ABNORMAL LOW (ref >90)
Potassium: 4.2 mEq/L (ref 3.5–5.1)

## 2012-08-19 SURGERY — PARATHYROIDECTOMY
Anesthesia: General | Site: Neck | Wound class: Clean

## 2012-08-19 MED ORDER — HYDROMORPHONE HCL PF 1 MG/ML IJ SOLN
1.0000 mg | INTRAMUSCULAR | Status: DC | PRN
  Administered 2012-08-19: 1 mg via INTRAVENOUS
  Filled 2012-08-19: qty 1

## 2012-08-19 MED ORDER — GLYCOPYRROLATE 0.2 MG/ML IJ SOLN
INTRAMUSCULAR | Status: DC | PRN
  Administered 2012-08-19: .3 mg via INTRAVENOUS

## 2012-08-19 MED ORDER — ACETAMINOPHEN 325 MG PO TABS: 650.0000 mg | ORAL_TABLET | ORAL | Status: DC | PRN

## 2012-08-19 MED ORDER — FENTANYL CITRATE 0.05 MG/ML IJ SOLN: 25.0000 ug | INTRAMUSCULAR | Status: DC | PRN

## 2012-08-19 MED ORDER — CEFAZOLIN SODIUM-DEXTROSE 2-3 GM-% IV SOLR
2.0000 g | INTRAVENOUS | Status: AC
  Administered 2012-08-19: 2 g via INTRAVENOUS

## 2012-08-19 MED ORDER — ONDANSETRON HCL 4 MG/2ML IJ SOLN: 4.0000 mg | Freq: Four times a day (QID) | INTRAMUSCULAR | Status: DC | PRN

## 2012-08-19 MED ORDER — NIACIN ER 500 MG PO CPCR
500.0000 mg | ORAL_CAPSULE | Freq: Every day | ORAL | Status: DC
  Administered 2012-08-19: 500 mg via ORAL
  Filled 2012-08-19 (×2): qty 1

## 2012-08-19 MED ORDER — ROCURONIUM BROMIDE 100 MG/10ML IV SOLN
INTRAVENOUS | Status: DC | PRN
  Administered 2012-08-19: 40 mg via INTRAVENOUS

## 2012-08-19 MED ORDER — MIDAZOLAM HCL 5 MG/5ML IJ SOLN
INTRAMUSCULAR | Status: DC | PRN
  Administered 2012-08-19 (×2): 1 mg via INTRAVENOUS

## 2012-08-19 MED ORDER — PROPOFOL 10 MG/ML IV BOLUS
INTRAVENOUS | Status: DC | PRN
  Administered 2012-08-19: 100 mg via INTRAVENOUS

## 2012-08-19 MED ORDER — FENTANYL CITRATE 0.05 MG/ML IJ SOLN
INTRAMUSCULAR | Status: DC | PRN
  Administered 2012-08-19 (×2): 100 ug via INTRAVENOUS
  Administered 2012-08-19 (×3): 50 ug via INTRAVENOUS

## 2012-08-19 MED ORDER — DIPHENHYDRAMINE HCL 50 MG/ML IJ SOLN
INTRAMUSCULAR | Status: AC
  Filled 2012-08-19: qty 1

## 2012-08-19 MED ORDER — DIPHENHYDRAMINE HCL 50 MG/ML IJ SOLN
12.5000 mg | Freq: Once | INTRAMUSCULAR | Status: AC
  Administered 2012-08-19: 12.5 mg via INTRAVENOUS

## 2012-08-19 MED ORDER — KCL IN DEXTROSE-NACL 20-5-0.45 MEQ/L-%-% IV SOLN
INTRAVENOUS | Status: DC
  Administered 2012-08-19: 15:00:00 via INTRAVENOUS
  Filled 2012-08-19 (×2): qty 1000

## 2012-08-19 MED ORDER — ONDANSETRON HCL 4 MG PO TABS: 4.0000 mg | ORAL_TABLET | Freq: Four times a day (QID) | ORAL | Status: DC | PRN

## 2012-08-19 MED ORDER — HYDROCODONE-ACETAMINOPHEN 5-325 MG PO TABS
1.0000 | ORAL_TABLET | ORAL | Status: DC | PRN
  Administered 2012-08-19: 2 via ORAL
  Filled 2012-08-19: qty 2

## 2012-08-19 MED ORDER — BUPIVACAINE HCL (PF) 0.5 % IJ SOLN
INTRAMUSCULAR | Status: AC
  Filled 2012-08-19: qty 30

## 2012-08-19 MED ORDER — ACETAMINOPHEN 10 MG/ML IV SOLN: INTRAVENOUS | Status: DC | PRN

## 2012-08-19 MED ORDER — BUPIVACAINE HCL (PF) 0.5 % IJ SOLN
INTRAMUSCULAR | Status: DC | PRN
  Administered 2012-08-19: 10 mL

## 2012-08-19 MED ORDER — CEFAZOLIN SODIUM-DEXTROSE 2-3 GM-% IV SOLR
INTRAVENOUS | Status: AC
  Filled 2012-08-19: qty 50

## 2012-08-19 MED ORDER — ONDANSETRON HCL 4 MG/2ML IJ SOLN
INTRAMUSCULAR | Status: DC | PRN
  Administered 2012-08-19: 4 mg via INTRAVENOUS

## 2012-08-19 MED ORDER — ACETAMINOPHEN 10 MG/ML IV SOLN
INTRAVENOUS | Status: DC | PRN
  Administered 2012-08-19: 1000 mg via INTRAVENOUS

## 2012-08-19 MED ORDER — LACTATED RINGERS IV SOLN
INTRAVENOUS | Status: DC
  Administered 2012-08-19: 1000 mL via INTRAVENOUS
  Administered 2012-08-19: 11:00:00 via INTRAVENOUS

## 2012-08-19 MED ORDER — LIDOCAINE HCL (CARDIAC) 20 MG/ML IV SOLN
INTRAVENOUS | Status: DC | PRN
  Administered 2012-08-19: 80 mg via INTRAVENOUS

## 2012-08-19 MED ORDER — ACETAMINOPHEN 10 MG/ML IV SOLN
INTRAVENOUS | Status: AC
  Filled 2012-08-19: qty 100

## 2012-08-19 MED ORDER — NEOSTIGMINE METHYLSULFATE 1 MG/ML IJ SOLN
INTRAMUSCULAR | Status: DC | PRN
  Administered 2012-08-19: 2.5 mg via INTRAVENOUS

## 2012-08-19 MED ORDER — LOSARTAN POTASSIUM 50 MG PO TABS
50.0000 mg | ORAL_TABLET | Freq: Every morning | ORAL | Status: DC
  Administered 2012-08-19: 50 mg via ORAL
  Filled 2012-08-19 (×2): qty 1

## 2012-08-19 SURGICAL SUPPLY — 41 items
APL SKNCLS STERI-STRIP NONHPOA (GAUZE/BANDAGES/DRESSINGS) ×1
ATTRACTOMAT 16X20 MAGNETIC DRP (DRAPES) ×2 IMPLANT
BENZOIN TINCTURE PRP APPL 2/3 (GAUZE/BANDAGES/DRESSINGS) ×2 IMPLANT
BLADE HEX COATED 2.75 (ELECTRODE) ×2 IMPLANT
BLADE SURG 15 STRL LF DISP TIS (BLADE) ×1 IMPLANT
BLADE SURG 15 STRL SS (BLADE) ×2
CANISTER SUCTION 2500CC (MISCELLANEOUS) ×2 IMPLANT
CHLORAPREP W/TINT 10.5 ML (MISCELLANEOUS) ×2 IMPLANT
CLIP TI MEDIUM 6 (CLIP) ×4 IMPLANT
CLIP TI WIDE RED SMALL 6 (CLIP) ×6 IMPLANT
CLOTH BEACON ORANGE TIMEOUT ST (SAFETY) ×2 IMPLANT
DISSECTOR ROUND CHERRY 3/8 STR (MISCELLANEOUS) IMPLANT
DRAPE PED LAPAROTOMY (DRAPES) ×2 IMPLANT
DRESSING SURGICEL FIBRLLR 1X2 (HEMOSTASIS) ×1 IMPLANT
DRSG SURGICEL FIBRILLAR 1X2 (HEMOSTASIS) ×2
ELECT REM PT RETURN 9FT ADLT (ELECTROSURGICAL) ×2
ELECTRODE REM PT RTRN 9FT ADLT (ELECTROSURGICAL) ×1 IMPLANT
GAUZE SPONGE 4X4 16PLY XRAY LF (GAUZE/BANDAGES/DRESSINGS) ×3 IMPLANT
GLOVE SURG ORTHO 8.0 STRL STRW (GLOVE) ×2 IMPLANT
GOWN STRL NON-REIN LRG LVL3 (GOWN DISPOSABLE) ×1 IMPLANT
GOWN STRL REIN XL XLG (GOWN DISPOSABLE) ×5 IMPLANT
KIT BASIN OR (CUSTOM PROCEDURE TRAY) ×2 IMPLANT
NDL HYPO 25X1 1.5 SAFETY (NEEDLE) ×1 IMPLANT
NEEDLE HYPO 25X1 1.5 SAFETY (NEEDLE) ×2 IMPLANT
NS IRRIG 1000ML POUR BTL (IV SOLUTION) ×2 IMPLANT
PACK BASIC VI WITH GOWN DISP (CUSTOM PROCEDURE TRAY) ×2 IMPLANT
PENCIL BUTTON HOLSTER BLD 10FT (ELECTRODE) ×2 IMPLANT
SPONGE GAUZE 4X4 12PLY (GAUZE/BANDAGES/DRESSINGS) ×1 IMPLANT
STAPLER VISISTAT 35W (STAPLE) ×2 IMPLANT
STRIP CLOSURE SKIN 1/2X4 (GAUZE/BANDAGES/DRESSINGS) ×2 IMPLANT
SUT MNCRL AB 4-0 PS2 18 (SUTURE) ×2 IMPLANT
SUT SILK 2 0 (SUTURE) ×2
SUT SILK 2-0 18XBRD TIE 12 (SUTURE) ×1 IMPLANT
SUT SILK 3 0 (SUTURE)
SUT SILK 3-0 18XBRD TIE 12 (SUTURE) IMPLANT
SUT VIC AB 3-0 SH 18 (SUTURE) ×2 IMPLANT
SYR BULB IRRIGATION 50ML (SYRINGE) ×2 IMPLANT
SYR CONTROL 10ML LL (SYRINGE) ×2 IMPLANT
TAPE CLOTH SURG 4X10 WHT LF (GAUZE/BANDAGES/DRESSINGS) ×1 IMPLANT
TOWEL OR 17X26 10 PK STRL BLUE (TOWEL DISPOSABLE) ×2 IMPLANT
YANKAUER SUCT BULB TIP 10FT TU (MISCELLANEOUS) ×2 IMPLANT

## 2012-08-19 NOTE — Brief Op Note (Signed)
08/19/2012  11:39 AM  PATIENT:  Patrick Spence  79 y.o. male  PRE-OPERATIVE DIAGNOSIS:  Primary hyperparathyroidism   POST-OPERATIVE DIAGNOSIS:  same  PROCEDURE:  1.  Neck exploration  2. Right inferior parathyroidectomy  SURGEON:  Surgeon(s) and Role:    * Velora Heckler, MD - Primary    * Romie Levee, MD - first assistant  ANESTHESIA:   general  EBL:  Total I/O In: 1000 [I.V.:1000] Out: -   BLOOD ADMINISTERED:none  DRAINS: none   LOCAL MEDICATIONS USED:  MARCAINE     SPECIMEN:  Excision  DISPOSITION OF SPECIMEN:  PATHOLOGY  COUNTS:  YES  TOURNIQUET:  * No tourniquets in log *  DICTATION: .Other Dictation: Dictation Number [id number]  PLAN OF CARE: Admit for overnight observation  PATIENT DISPOSITION:  PACU - hemodynamically stable.   Delay start of Pharmacological VTE agent (>24hrs) due to surgical blood loss or risk of bleeding: yes  Velora Heckler, MD, FACS General & Endocrine Surgery [facility] Office: [phone]

## 2012-08-19 NOTE — Anesthesia Postprocedure Evaluation (Signed)
  Anesthesia Post-op Note  Patient: Patrick Spence  Procedure(s) Performed: Procedure(s) (LRB): PARATHYROIDECTOMY and neck exploration  (N/A)  Patient Location: PACU  Anesthesia Type: General  Level of Consciousness: awake and alert   Airway and Oxygen Therapy: Patient Spontanous Breathing  Post-op Pain: mild  Post-op Assessment: Post-op Vital signs reviewed, Patient's Cardiovascular Status Stable, Respiratory Function Stable, Patent Airway and No signs of Nausea or vomiting  Last Vitals:  Filed Vitals:   08/19/12 1241  BP:   Pulse: 63  Temp:   Resp: 12    Post-op Vital Signs: stable   Complications: No apparent anesthesia complications

## 2012-08-19 NOTE — Anesthesia Preprocedure Evaluation (Addendum)
Anesthesia Evaluation  Patient identified by MRN, date of birth, ID band Patient awake    Reviewed: Allergy & Precautions, H&P , NPO status , Patient's Chart, lab work & pertinent test results  Airway Mallampati: II TM Distance: >3 FB Neck ROM: Full    Dental no notable dental hx.    Pulmonary neg pulmonary ROS,  breath sounds clear to auscultation  Pulmonary exam normal       Cardiovascular Exercise Tolerance: Good hypertension, Pt. on medications negative cardio ROS  Rhythm:Regular Rate:Normal     Neuro/Psych PSYCHIATRIC DISORDERS Anxiety Depression Silent stroke, no symptoms. Found incidentally on scan per patient. CVA, No Residual Symptoms    GI/Hepatic negative GI ROS, Neg liver ROS,   Endo/Other  negative endocrine ROS  Renal/GU negative Renal ROS  negative genitourinary   Musculoskeletal negative musculoskeletal ROS (+)   Abdominal   Peds negative pediatric ROS (+)  Hematology negative hematology ROS (+)   Anesthesia Other Findings   Reproductive/Obstetrics negative OB ROS                          Anesthesia Physical Anesthesia Plan  ASA: III  Anesthesia Plan: General   Post-op Pain Management:    Induction: Intravenous  Airway Management Planned: Oral ETT  Additional Equipment:   Intra-op Plan:   Post-operative Plan: Extubation in OR  Informed Consent: I have reviewed the patients History and Physical, chart, labs and discussed the procedure including the risks, benefits and alternatives for the proposed anesthesia with the patient or authorized representative who has indicated his/her understanding and acceptance.   Dental advisory given  Plan Discussed with: CRNA  Anesthesia Plan Comments:         Anesthesia Quick Evaluation

## 2012-08-19 NOTE — H&P (Signed)
Patrick Spence is an 79 y.o. male.    General Surgery [facility]  Chief Complaint: primary hyperparathyroidism  HPI: Patient is a 79 year old white male referred by his primary care physician for evaluation of hypercalcemia. Patient has a history of osteopenia. He had a significant fracture of his pelvis and left wrist 10 years ago. He has been recently noted on laboratory studies to have elevated serum calcium levels ranging from 11.3-11.5. Ionized calcium levels were elevated at 6.1-6.3. A 24-hour urine collection for calcium was elevated at 375. Intact PTH levels have ranged from 25-48. Patient is now referred for evaluation for suspected primary hyperparathyroidism.  Patient has no prior history of endocrine disease. He denies nephrolithiasis. He has had no prior surgery on the neck. There is no family history of endocrine disease. Patient underwent nuclear medicine parathyroid scan and an MRI scan of the neck, both of which failed to reveal the location of a parathyroid adenoma.  Patient now comes to surgery for neck exploration and parathyroidectomy.  Past Medical History  Diagnosis Date  . Arthritis   . HLD (hyperlipidemia)   . Hypertension   . Anxiety   . Mini stroke     NO RESIDULE PROBLEMS  . Osteopenia   . Primary hyperparathyroidism   . Depression     Past Surgical History  Procedure Laterality Date  . Tonsillectomy    . Wrist surgery    . Joint replacement      L TOTAL HIP  . Pelvis fracture      Family History  Problem Relation Age of Onset  . Cancer Father     stomach  . Cancer Mother     lung   Social History:  reports that he has never smoked. He has never used smokeless tobacco. He reports that  drinks alcohol. He reports that he does not use illicit drugs.  Allergies: No Known Allergies  Medications Prior to Admission  Medication Sig Dispense Refill  . alendronate (FOSAMAX) 70 MG tablet Take 70 mg by mouth every 7 (seven) days.  Take with a full glass of water on an empty stomach.      Marland Kitchen aspirin 325 MG tablet Take 325 mg by mouth daily.      . Calcium Carbonate-Vitamin D (CALCIUM 600 + D PO) Take 1 tablet by mouth 2 (two) times daily.      . fish oil-omega-3 fatty acids 1000 MG capsule Take 1 g by mouth daily.       Marland Kitchen losartan (COZAAR) 50 MG tablet Take 50 mg by mouth every morning.       . Multiple Vitamins-Minerals (CENTRUM SILVER ADULT 50+ PO) Take 1 tablet by mouth daily.       . niacin 500 MG CR capsule Take 500 mg by mouth at bedtime.      Marland Kitchen OVER THE COUNTER MEDICATION Take 1 tablet by mouth daily. Glucosamine 2000 mg      . simvastatin (ZOCOR) 10 MG tablet Take 10 mg by mouth at bedtime.        No results found for this or any previous visit (from the past 48 hour(s)). No results found.  Review of Systems  Constitutional: Positive for malaise/fatigue.  HENT: Negative.   Eyes: Negative.   Respiratory: Negative.   Cardiovascular: Negative.   Gastrointestinal: Negative.   Musculoskeletal: Positive for joint pain.  Skin: Negative.   Neurological: Negative.   Endo/Heme/Allergies: Negative.   Psychiatric/Behavioral: Negative.     Blood  pressure 155/92, pulse 63, temperature 97.4 F (36.3 C), temperature source Oral, resp. rate 18, SpO2 100.00%. Physical Exam  Constitutional: He is oriented to person, place, and time. He appears well-developed and well-nourished. No distress.  HENT:  Head: Normocephalic and atraumatic.  Right Ear: External ear normal.  Left Ear: External ear normal.  Mouth/Throat: Oropharynx is clear and moist.  Eyes: Conjunctivae are normal. Pupils are equal, round, and reactive to light. No scleral icterus.  Neck: Normal range of motion. Neck supple. No thyromegaly present.  Cardiovascular: Normal rate, regular rhythm and normal heart sounds.   No murmur heard. Respiratory: Effort normal and breath sounds normal. He has no wheezes.  GI: Soft. Bowel sounds are normal. He exhibits  no distension.  Musculoskeletal: Normal range of motion. He exhibits no edema.  Lymphadenopathy:    He has no cervical adenopathy.  Neurological: He is alert and oriented to person, place, and time.  Skin: Skin is warm and dry.  Psychiatric: He has a normal mood and affect. His behavior is normal.     Assessment/Plan Primary hyperparathyroidism  Plan neck exploration and parathyroidectomy  The risks and benefits of the procedure have been discussed at length with the patient.  The patient understands the proposed procedure, potential alternative treatments, and the course of recovery to be expected.  All of the patient's questions have been answered at this time.  The patient wishes to proceed with surgery.  Velora Heckler, MD, FACS General & Endocrine Surgery [facility] Office: [phone]   Patrick Spence Judie Petit 08/19/2012, 7:22 AM

## 2012-08-19 NOTE — Transfer of Care (Signed)
Immediate Anesthesia Transfer of Care Note  Patient: Patrick Spence  Procedure(s) Performed: Procedure(s): PARATHYROIDECTOMY and neck exploration  (N/A)  Patient Location: PACU  Anesthesia Type:General  Level of Consciousness: awake and alert   Airway & Oxygen Therapy: Patient Spontanous Breathing and Patient connected to face mask oxygen  Post-op Assessment: Report given to PACU RN and Post -op Vital signs reviewed and stable  Post vital signs: Reviewed and stable  Complications: No apparent anesthesia complications

## 2012-08-19 NOTE — Preoperative (Signed)
Beta Blockers   Reason not to administer Beta Blockers:Not Applicable 

## 2012-08-20 ENCOUNTER — Encounter (HOSPITAL_COMMUNITY): Payer: Self-pay | Admitting: Surgery

## 2012-08-20 LAB — BASIC METABOLIC PANEL
CO2: 28 mEq/L (ref 19–32)
Calcium: 8.6 mg/dL (ref 8.4–10.5)
Creatinine, Ser: 1.03 mg/dL (ref 0.50–1.35)
GFR calc Af Amer: 79 mL/min — ABNORMAL LOW (ref >90)
GFR calc non Af Amer: 68 mL/min — ABNORMAL LOW (ref >90)
Sodium: 136 mEq/L (ref 135–145)

## 2012-08-20 MED ORDER — HYDROCODONE-ACETAMINOPHEN 5-325 MG PO TABS: 1.0000 | ORAL_TABLET | ORAL | Status: AC | PRN

## 2012-08-20 NOTE — Op Note (Signed)
Patrick Spence, [place] NO.:  [id number]  MEDICAL RECORD NO.:  [id number]  LOCATION:  1529                         FACILITY:  [facility]  PHYSICIAN:  Velora Heckler, MD      DATE OF BIRTH:  [birth year]  DATE OF PROCEDURE:  08/19/2012                               OPERATIVE REPORT   PREOPERATIVE DIAGNOSIS:  Primary hyperparathyroidism.  POSTOPERATIVE DIAGNOSIS:  Primary hyperparathyroidism.  PROCEDURES: 1. Neck exploration. 2. Right inferior parathyroidectomy.  SURGEON:  Velora Heckler, MD, FACS  ASSISTANT:  Romie Levee, MD  ANESTHESIA:  General per Quentin Cornwall. Council Mechanic, MD  ESTIMATED BLOOD LOSS:  Minimal.  PREPARATION:  ChloraPrep.  COMPLICATIONS:  None.  INDICATIONS:  The patient is a 79 year old white male, referred by Dr. Sigmund Hazel with signs and symptoms of primary hyperparathyroidism.  The patient has a history of osteopenia.  Recent laboratory study showed elevated serum calcium levels ranging from 11.3 to 11.5 and elevated ionized calcium levels ranging from 6.1 to 6.3.  A 24-hour urine collection for calcium was elevated at 375.  PTH levels were in the upper range of normal at 48.  The patient underwent nuclear medicine parathyroid scan which failed to localize a parathyroid adenoma. Likewise, an MRI scan of the neck failed to localize a parathyroid adenoma.  The patient now comes to Surgery for neck exploration and parathyroidectomy.  OPERATIVE REPORT:  Procedure was done in OR #1 at the [facility].  The patient was brought to the operating room, placed in a supine position on the operating room table.  Following administration of general anesthesia, the patient was positioned and then prepped and draped in the usual aseptic fashion.  After ascertaining that an adequate level of anesthesia had been achieved, a Kocher incision was made with a #15 blade.  Dissection was carried through subcutaneous tissues and platysma.   Hemostasis was obtained with the electrocautery.  Skin flaps were elevated cephalad and caudad from the thyroid notch to the sternal notch.  The Mahorner self-retaining retractor was placed for exposure.  Strap muscles were incised in the midline and dissection was begun on the left side of the neck.  Strap muscles were mobilized laterally.  Left thyroid lobe was gently dissected out.  Middle thyroid vein was divided between Ligaclips.  The entire left lobe was mobilized.  Exploration reveals a normal left superior parathyroid gland and a normal left inferior parathyroid gland. No other gross abnormality was identified.  There is subtle nodularity of the left thyroid lobe, but no dominant or discrete mass is identified.  Dry pack was placed in the left neck.  Next, we turned our attention to the right thyroid lobe.  Again, the strap muscles were reflected laterally.  Right lobe was dissected out. Middle thyroid vein was divided between Ligaclips.  The right neck was thoroughly explored.  There appears to be a mildly enlarged right inferior parathyroid gland at the inferior pole of the right thyroid lobe.  There was also a nodular density, measuring approximately 1 cm in size at the level of the recurrent laryngeal nerve and the inferior thyroid artery.  Superiorly, there was no sign of an enlarged parathyroid  gland.  Indeed a normal parathyroid gland was not identified in this location.  The nodule at the level of the nerve and the inferior thyroid artery was dissected out and resected.  Vascular structures were divided between small Ligaclips.  Electrocautery was used for hemostasis.  This nodule was submitted to Pathology.  Frozen section confirms thyroid tissue.  Further dissection includes opening of the retroesophageal space.  The area of the precervical fascia was thoroughly explored.  The right carotid sheath was completely opened and explored.  Inferiorly, the thyrothymic tracts  on both the left and the right side are explored.  No other evidence of ectopic parathyroid tissue was identified.  Decision was made to proceed with right inferior parathyroidectomy.  This gland is dissected out.  Vascular pedicle was divided between Ligaclips and the gland was excised.  It was submitted for frozen section which confirmed parathyroid tissue which appears hypercellular and somewhat fat depleted consistent with possible adenoma.  The neck was irrigated with warm saline.  Fibrillar was placed throughout the operative field.  Strap muscles were reapproximated in the midline with interrupted 3-0 Vicryl sutures.  Platysma was closed with interrupted 3-0 Vicryl sutures.  Skin was anesthetized with local anesthetic.  Skin edges were reapproximated with a running 4-0 Monocryl subcuticular suture.  Wound was washed and dried and benzoin and Steri- Strips were applied.  Sterile dressings were applied.  The patient was awakened from anesthesia and brought to the recovery room.  The patient tolerated the procedure well.   Velora Heckler, MD, FACS General & Endocrine Surgery [facility] Office: [phone]   TMG/MEDQ  D:  08/19/2012  T:  08/20/2012  Job:  [id number]  cc:   Sigmund Hazel, M.D. Fax: [phone]

## 2012-08-20 NOTE — Care Management Note (Signed)
    Page 1 of 1   08/20/2012     11:03:56 AM   CARE MANAGEMENT NOTE 08/20/2012  Patient:  Patrick Spence, Patrick Spence   Account Number:  [id number]  Date Initiated:  08/20/2012  Documentation initiated by:  Lorenda Ishihara  Subjective/Objective Assessment:   79 yo male admitted s/p parathyroidectomy, neck exploration.     Action/Plan:   Home when stable   Anticipated DC Date:  08/20/2012   Anticipated DC Plan:  HOME/SELF CARE      DC Planning Services  CM consult      Choice offered to / List presented to:             Status of service:  Completed, signed off Medicare Important Message given?   (If response is "NO", the following Medicare IM given date fields will be blank) Date Medicare IM given:   Date Additional Medicare IM given:    Discharge Disposition:  HOME/SELF CARE  Per UR Regulation:  Reviewed for med. necessity/level of care/duration of stay  If discussed at Long Length of Stay Meetings, dates discussed:    Comments:

## 2012-08-20 NOTE — Discharge Summary (Signed)
Physician Discharge Summary [facility]  Patient ID: Patrick Spence MRN: [id number] DOB/AGE: 77-Apr-1934 77 y.o.  Admit date: 08/19/2012 Discharge date: 08/20/2012  Admission Diagnoses:  Primary hyperparathyroidism  Discharge Diagnoses:  Active Problems:   * No active hospital problems. *   Discharged Condition: good  Hospital Course: patient admitted for observation after neck exploration and parathyroidectomy.  Stable post op course.  Tolerating diet.  Pain well controlled.  Calcium level fell from 10.4 pre-op to 9.5 evening after surgery to 8.6 this morning.  Consults: None  Significant Diagnostic Studies: labs: calcium levels  Treatments: surgery: neck exploration with parathyroidectomy  Discharge Exam: Blood pressure 149/83, pulse 83, temperature 98.1 F (36.7 C), temperature source Oral, resp. rate 16, height 5' 7.75" (1.721 m), weight 187 lb 8 oz (85.049 kg), SpO2 96.00%. HEENT - clear Neck - wound clear and dry; voice normal; minimal STS Chest - clear  Disposition: Home with family  Discharge Orders   Future Orders Complete By Expires     Apply dressing  As directed     Scheduling Instructions:      Apply dry gauze dressing to wound before discharge home today.    Diet - low sodium heart healthy  As directed     Discharge instructions  As directed     Comments:      THYROID & PARATHYROID SURGERY - POST OP INSTRUCTIONS  Always review your discharge instruction sheet from the facility where your surgery was performed.  A prescription for pain medication may be given to you upon discharge.  Take your pain medication as prescribed.  If narcotic pain medicine is not needed, then you may take acetaminophen (Tylenol) or ibuprofen (Advil) as needed.  Take your usually prescribed medications unless otherwise directed.  If you need a refill on your pain medication, please contact your pharmacy. They will contact our office to request authorization.   Prescriptions will not be processed after 5 pm or on weekends.  Start with a light diet upon arrival home, such as soup and crackers or toast.  Be sure to drink plenty of fluids daily.  Resume your normal diet the day after surgery.  Most patients will experience some swelling and bruising on the chest and neck area.  Ice packs will help.  Swelling and bruising can take several days to resolve.   It is common to experience some constipation if taking pain medication after surgery.  Increasing fluid intake and taking a stool softener will usually help or prevent this problem.  A mild laxative (Milk of Magnesia or Miralax) should be taken according to package directions if there are no bowel movements after 48 hours.  You may remove your bandages 24-48 hours after surgery, and you may shower at that time.  You have steri-strips (small skin tapes) in place directly over the incision.  These strips should be left on the skin for 7-10 days and then removed.  You may resume regular (light) daily activities beginning the next day-such as daily self-care, walking, climbing stairs-gradually increasing activities as tolerated.  You may have sexual intercourse when it is comfortable.  Refrain from any heavy lifting or straining until approved by your doctor.  You may drive when you no longer are taking prescription pain medication, you can comfortably wear a seatbelt, and you can safely maneuver your car and apply brakes.  You should see your doctor in the office for a follow-up appointment approximately two to three weeks after your surgery.  Make sure that you call for this appointment within a day or two after you arrive home to insure a convenient appointment time.  WHEN TO CALL YOUR DOCTOR: -- Fever greater than 101.5 -- Inability to urinate -- Nausea and/or vomiting - persistent -- Extreme swelling or bruising -- Continued bleeding from incision -- Increased pain, redness, or drainage from the  incision -- Difficulty swallowing or breathing -- Muscle cramping or spasms -- Numbness or tingling in hands or around lips  The clinic staff is available to answer your questions during regular business hours.  Please don't hesitate to call and ask to speak to one of the nurses if you have concerns.  Velora Heckler, MD, FACS General & Endocrine Surgery [facility] Office: [phone]    Increase activity slowly  As directed     Remove dressing in 24 hours  As directed         Medication List    TAKE these medications       alendronate 70 MG tablet  Commonly known as:  FOSAMAX  Take 70 mg by mouth every 7 (seven) days. Take with a full glass of water on an empty stomach.     aspirin 325 MG tablet  Take 325 mg by mouth daily.     CALCIUM 600 + D PO  Take 1 tablet by mouth 2 (two) times daily.     CENTRUM SILVER ADULT 50+ PO  Take 1 tablet by mouth daily.     fish oil-omega-3 fatty acids 1000 MG capsule  Take 1 g by mouth daily.     HYDROcodone-acetaminophen 5-325 MG per tablet  Commonly known as:  NORCO/VICODIN  Take 1-2 tablets by mouth every 4 (four) hours as needed for pain.     losartan 50 MG tablet  Commonly known as:  COZAAR  Take 50 mg by mouth every morning.     niacin 500 MG CR capsule  Take 500 mg by mouth at bedtime.     OVER THE COUNTER MEDICATION  Take 1 tablet by mouth daily. Glucosamine 2000 mg     simvastatin 10 MG tablet  Commonly known as:  ZOCOR  Take 10 mg by mouth at bedtime.         Velora Heckler, MD, [facility] Office: [phone]   Signed: Velora Heckler 08/20/2012, 10:19 AM

## 2012-08-21 ENCOUNTER — Telehealth (INDEPENDENT_AMBULATORY_CARE_PROVIDER_SITE_OTHER): Payer: Self-pay | Admitting: General Surgery

## 2012-08-21 ENCOUNTER — Other Ambulatory Visit (INDEPENDENT_AMBULATORY_CARE_PROVIDER_SITE_OTHER): Payer: Self-pay | Admitting: Surgery

## 2012-08-21 ENCOUNTER — Encounter (INDEPENDENT_AMBULATORY_CARE_PROVIDER_SITE_OTHER): Payer: Self-pay

## 2012-08-21 DIAGNOSIS — E210 Primary hyperparathyroidism: Secondary | ICD-10-CM

## 2012-08-21 MED ORDER — PROMETHAZINE HCL 25 MG PO TABS: 25.0000 mg | ORAL_TABLET | ORAL | Status: DC | PRN

## 2012-08-21 NOTE — Telephone Encounter (Signed)
Patient wife called and stated that he husband was having vomiting last night and this morning and now was feeling nausea, his neck was sore from the surgery site. She stated that he took some aleve and it did help with the pain. I told the wife that Dr Gerrit Friends was in clinic today and I will talk to him about her husband and what he had going on, Dr Gerrit Friends wanted to know if he was having any numbness on the surgery site, she stated no, but if he started to have numbness that they need to call the office and he will have to go and have lab work done to check for his calcium level drawn. Dr Gerrit Friends has sent to the patient pharmacy some phenergan for nausea

## 2012-08-22 ENCOUNTER — Other Ambulatory Visit (INDEPENDENT_AMBULATORY_CARE_PROVIDER_SITE_OTHER): Payer: Self-pay

## 2012-08-22 ENCOUNTER — Telehealth (INDEPENDENT_AMBULATORY_CARE_PROVIDER_SITE_OTHER): Payer: Self-pay

## 2012-08-22 NOTE — Telephone Encounter (Signed)
Pt states he is doing well. Neck is still a bit sore but getting better. Pt advised lab slip in mail for him to have lab drawn 09-09-12.

## 2012-09-09 ENCOUNTER — Other Ambulatory Visit (INDEPENDENT_AMBULATORY_CARE_PROVIDER_SITE_OTHER): Payer: Self-pay | Admitting: Surgery

## 2012-09-16 ENCOUNTER — Ambulatory Visit (INDEPENDENT_AMBULATORY_CARE_PROVIDER_SITE_OTHER): Payer: Medicare Other | Admitting: Surgery

## 2012-09-16 ENCOUNTER — Encounter (INDEPENDENT_AMBULATORY_CARE_PROVIDER_SITE_OTHER): Payer: Self-pay | Admitting: Surgery

## 2012-09-16 VITALS — BP 150/84 | HR 86 | Temp 97.8°F | Ht 67.0 in | Wt 184.0 lb

## 2012-09-16 DIAGNOSIS — E210 Primary hyperparathyroidism: Secondary | ICD-10-CM

## 2012-09-16 LAB — PTH, INTACT AND CALCIUM
Calcium: 9.4 mg/dL (ref 8.6–10.2)
PTH: 20 pg/mL (ref 15–65)

## 2012-09-16 NOTE — Progress Notes (Signed)
General Surgery [facility]  Visit Diagnoses: 1. Hyperparathyroidism, primary   2. Hypercalcemia     HISTORY: Patient is a 79 year old white male who underwent neck exploration and parathyroidectomy on 08/19/2012. He was found to have a right inferior parathyroid adenoma measuring 1.1 cm in size and weighing 500 mg.  Postoperative laboratory studies dated 09/09/2012 showing normal serum calcium level of 9.4 and a normal intact PTH level of 20.  EXAM: Surgical incision is well-healed. No sign of seroma. No sign of infection. Voice quality is normal.  IMPRESSION: Status post neck exploration and right inferior parathyroidectomy  PLAN: Patient will begin applying topical creams to his incision. He should have a serum calcium level checked in 6 months and then annually thereafter. This can be performed by his primary care physician.  Patient will return for surgical care as needed.  Velora Heckler, MD, FACS General & Endocrine Surgery [facility]

## 2012-09-16 NOTE — Patient Instructions (Signed)
  COCOA BUTTER & VITAMIN E CREAM  ([employer] or other brand)  Apply cocoa butter/vitamin E cream to your incision 2 - 3 times daily.  Massage cream into incision for one minute with each application.  Use sunscreen (50 SPF or higher) for first 6 months after surgery if area is exposed to sun.  You may substitute Mederma or other scar reducing creams as desired.   

## 2012-12-11 ENCOUNTER — Other Ambulatory Visit: Payer: Self-pay

## 2013-03-13 ENCOUNTER — Other Ambulatory Visit: Payer: Self-pay

## 2013-11-13 IMAGING — NM NM PARATHYROID W/ SPECT
4 series · 24 of 24 positions shown · non-contrast
Comparison: None

CLINICAL DATA: Hyperparathyroidism.  PTH equal 25 to 48

NUCLEAR MEDICINE PARATHYROID WITH SPEC
Radiopharmaceutical: 25.0 mCi technetium sestamibi

[Series 1: pa parathyroid · 4.7mm · 4.75mm/px · 6 of 91 frames shown (1 of 4)]
[frame 8/91]
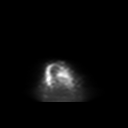
[frame 23/91]
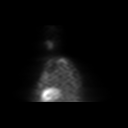
[frame 38/91]
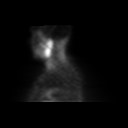
[frame 53/91]
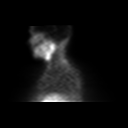
[frame 68/91]
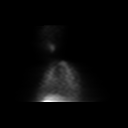
[frame 84/91]
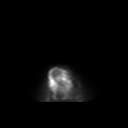

[Series 1: pa parathyroid · 4.7mm · 4.75mm/px · 6 of 91 frames shown (2 of 4)]
[frame 8/91]
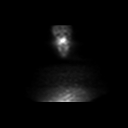
[frame 23/91]
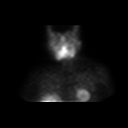
[frame 38/91]
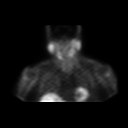
[frame 53/91]
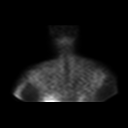
[frame 68/91]
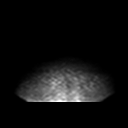
[frame 84/91]
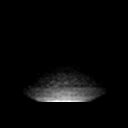

[Series 1: pa parathyroid · 4.7mm · 4.75mm/px · 6 of 91 frames shown (3 of 4)]
[frame 8/91]
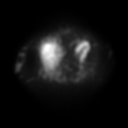
[frame 23/91]
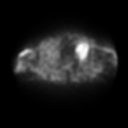
[frame 38/91]
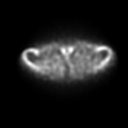
[frame 53/91]
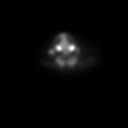
[frame 68/91]
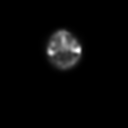
[frame 84/91]
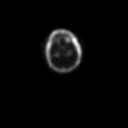

[Series 1: pa parathyroid · 4.75mm/px · 6 of 128 frames shown (4 of 4)]
[frame 11/128]
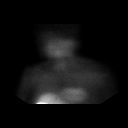
[frame 32/128]
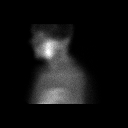
[frame 54/128]
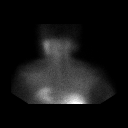
[frame 75/128]
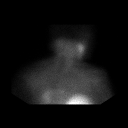
[frame 96/128]
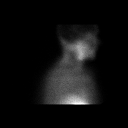
[frame 118/128]
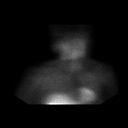

[24 of 24 positions shown; findings below may reference images not displayed]

FINDINGS: Immediate planar images demonstrate uptake within the
thyroid gland.  Activity washes out over the one hour and two hour
images with no focal residual activity to localize parathyroid
adenoma.  SPECT images fail to localize activity in the thyroid bed
or chest.
IMPRESSION: No focal activity within the neck or chest to localize parathyroid
adenoma.

## 2014-02-20 ENCOUNTER — Other Ambulatory Visit: Payer: Self-pay

## 2014-11-02 ENCOUNTER — Other Ambulatory Visit: Payer: Self-pay

## 2015-06-16 DIAGNOSIS — M25552 Pain in left hip: Secondary | ICD-10-CM | POA: Diagnosis not present

## 2015-09-27 DIAGNOSIS — Z800 Family history of malignant neoplasm of digestive organs: Secondary | ICD-10-CM | POA: Diagnosis not present

## 2015-09-27 DIAGNOSIS — Z1211 Encounter for screening for malignant neoplasm of colon: Secondary | ICD-10-CM | POA: Diagnosis not present

## 2015-09-27 DIAGNOSIS — K5730 Diverticulosis of large intestine without perforation or abscess without bleeding: Secondary | ICD-10-CM | POA: Diagnosis not present

## 2015-09-28 DIAGNOSIS — I10 Essential (primary) hypertension: Secondary | ICD-10-CM | POA: Diagnosis not present

## 2015-09-28 DIAGNOSIS — E7800 Pure hypercholesterolemia, unspecified: Secondary | ICD-10-CM | POA: Diagnosis not present

## 2015-09-28 DIAGNOSIS — Z125 Encounter for screening for malignant neoplasm of prostate: Secondary | ICD-10-CM | POA: Diagnosis not present

## 2015-10-01 DIAGNOSIS — I10 Essential (primary) hypertension: Secondary | ICD-10-CM | POA: Diagnosis not present

## 2015-10-01 DIAGNOSIS — I63549 Cerebral infarction due to unspecified occlusion or stenosis of unspecified cerebellar artery: Secondary | ICD-10-CM | POA: Diagnosis not present

## 2015-10-01 DIAGNOSIS — Z0000 Encounter for general adult medical examination without abnormal findings: Secondary | ICD-10-CM | POA: Diagnosis not present

## 2015-10-01 DIAGNOSIS — E7800 Pure hypercholesterolemia, unspecified: Secondary | ICD-10-CM | POA: Diagnosis not present

## 2015-10-01 DIAGNOSIS — Z800 Family history of malignant neoplasm of digestive organs: Secondary | ICD-10-CM | POA: Diagnosis not present

## 2015-10-01 DIAGNOSIS — M810 Age-related osteoporosis without current pathological fracture: Secondary | ICD-10-CM | POA: Diagnosis not present

## 2015-10-13 ENCOUNTER — Other Ambulatory Visit: Payer: Self-pay | Admitting: Dermatology

## 2015-10-13 DIAGNOSIS — D239 Other benign neoplasm of skin, unspecified: Secondary | ICD-10-CM | POA: Diagnosis not present

## 2015-10-13 DIAGNOSIS — D2262 Melanocytic nevi of left upper limb, including shoulder: Secondary | ICD-10-CM | POA: Diagnosis not present

## 2015-10-13 DIAGNOSIS — L570 Actinic keratosis: Secondary | ICD-10-CM | POA: Diagnosis not present

## 2015-10-13 DIAGNOSIS — D485 Neoplasm of uncertain behavior of skin: Secondary | ICD-10-CM | POA: Diagnosis not present

## 2015-10-13 DIAGNOSIS — L821 Other seborrheic keratosis: Secondary | ICD-10-CM | POA: Diagnosis not present

## 2015-10-23 DIAGNOSIS — D229 Melanocytic nevi, unspecified: Secondary | ICD-10-CM

## 2015-10-23 HISTORY — DX: Melanocytic nevi, unspecified: D22.9

## 2015-11-18 ENCOUNTER — Other Ambulatory Visit: Payer: Self-pay | Admitting: Dermatology

## 2015-11-18 DIAGNOSIS — D485 Neoplasm of uncertain behavior of skin: Secondary | ICD-10-CM | POA: Diagnosis not present

## 2015-11-18 DIAGNOSIS — L821 Other seborrheic keratosis: Secondary | ICD-10-CM | POA: Diagnosis not present

## 2015-11-18 DIAGNOSIS — I872 Venous insufficiency (chronic) (peripheral): Secondary | ICD-10-CM | POA: Diagnosis not present

## 2015-12-09 DIAGNOSIS — S46002A Unspecified injury of muscle(s) and tendon(s) of the rotator cuff of left shoulder, initial encounter: Secondary | ICD-10-CM | POA: Diagnosis not present

## 2016-03-13 DIAGNOSIS — Z23 Encounter for immunization: Secondary | ICD-10-CM | POA: Diagnosis not present

## 2016-04-10 DIAGNOSIS — E7800 Pure hypercholesterolemia, unspecified: Secondary | ICD-10-CM | POA: Diagnosis not present

## 2016-04-10 DIAGNOSIS — I10 Essential (primary) hypertension: Secondary | ICD-10-CM | POA: Diagnosis not present

## 2016-04-10 DIAGNOSIS — M810 Age-related osteoporosis without current pathological fracture: Secondary | ICD-10-CM | POA: Diagnosis not present

## 2016-05-22 DIAGNOSIS — H52203 Unspecified astigmatism, bilateral: Secondary | ICD-10-CM | POA: Diagnosis not present

## 2016-05-22 DIAGNOSIS — Z961 Presence of intraocular lens: Secondary | ICD-10-CM | POA: Diagnosis not present

## 2016-05-22 DIAGNOSIS — H02052 Trichiasis without entropian right lower eyelid: Secondary | ICD-10-CM | POA: Diagnosis not present

## 2016-06-05 DIAGNOSIS — Z85828 Personal history of other malignant neoplasm of skin: Secondary | ICD-10-CM | POA: Diagnosis not present

## 2016-06-05 DIAGNOSIS — D229 Melanocytic nevi, unspecified: Secondary | ICD-10-CM | POA: Diagnosis not present

## 2016-06-05 DIAGNOSIS — L570 Actinic keratosis: Secondary | ICD-10-CM | POA: Diagnosis not present

## 2016-06-25 DIAGNOSIS — R5383 Other fatigue: Secondary | ICD-10-CM | POA: Diagnosis not present

## 2016-09-25 DIAGNOSIS — E7800 Pure hypercholesterolemia, unspecified: Secondary | ICD-10-CM | POA: Diagnosis not present

## 2016-09-25 DIAGNOSIS — I10 Essential (primary) hypertension: Secondary | ICD-10-CM | POA: Diagnosis not present

## 2016-09-27 DIAGNOSIS — M810 Age-related osteoporosis without current pathological fracture: Secondary | ICD-10-CM | POA: Diagnosis not present

## 2016-09-27 DIAGNOSIS — Z0000 Encounter for general adult medical examination without abnormal findings: Secondary | ICD-10-CM | POA: Diagnosis not present

## 2016-09-27 DIAGNOSIS — E7800 Pure hypercholesterolemia, unspecified: Secondary | ICD-10-CM | POA: Diagnosis not present

## 2016-09-27 DIAGNOSIS — Z8673 Personal history of transient ischemic attack (TIA), and cerebral infarction without residual deficits: Secondary | ICD-10-CM | POA: Diagnosis not present

## 2016-09-27 DIAGNOSIS — Z800 Family history of malignant neoplasm of digestive organs: Secondary | ICD-10-CM | POA: Diagnosis not present

## 2016-09-27 DIAGNOSIS — I10 Essential (primary) hypertension: Secondary | ICD-10-CM | POA: Diagnosis not present

## 2017-02-07 DIAGNOSIS — Z23 Encounter for immunization: Secondary | ICD-10-CM | POA: Diagnosis not present

## 2017-04-04 ENCOUNTER — Other Ambulatory Visit: Payer: Self-pay | Admitting: Pharmacy Technician

## 2017-04-04 DIAGNOSIS — M810 Age-related osteoporosis without current pathological fracture: Secondary | ICD-10-CM | POA: Diagnosis not present

## 2017-04-04 DIAGNOSIS — R030 Elevated blood-pressure reading, without diagnosis of hypertension: Secondary | ICD-10-CM | POA: Diagnosis not present

## 2017-04-04 DIAGNOSIS — E7800 Pure hypercholesterolemia, unspecified: Secondary | ICD-10-CM | POA: Diagnosis not present

## 2017-04-04 DIAGNOSIS — I10 Essential (primary) hypertension: Secondary | ICD-10-CM | POA: Diagnosis not present

## 2017-04-04 DIAGNOSIS — Z8673 Personal history of transient ischemic attack (TIA), and cerebral infarction without residual deficits: Secondary | ICD-10-CM | POA: Diagnosis not present

## 2017-04-04 NOTE — Patient Outreach (Signed)
[facility]) Care Management  04/04/2017  PJ ZEHNER July 17, 1932 [id number]  Incoming [employer] Emmi call in reference to medication adherence. HIPAA identifiers verified and verbal consent received. Patient states that he is very diligent with taking his medication's daily as prescribed. He receives medication's through mail order and once he is down to 10-12 day supply he places a refill with [facility].  Doreene Burke, [place] [phone]

## 2017-10-22 DIAGNOSIS — Z8673 Personal history of transient ischemic attack (TIA), and cerebral infarction without residual deficits: Secondary | ICD-10-CM | POA: Diagnosis not present

## 2017-10-22 DIAGNOSIS — M810 Age-related osteoporosis without current pathological fracture: Secondary | ICD-10-CM | POA: Diagnosis not present

## 2017-10-22 DIAGNOSIS — Z125 Encounter for screening for malignant neoplasm of prostate: Secondary | ICD-10-CM | POA: Diagnosis not present

## 2017-10-22 DIAGNOSIS — Z0000 Encounter for general adult medical examination without abnormal findings: Secondary | ICD-10-CM | POA: Diagnosis not present

## 2017-10-22 DIAGNOSIS — Z6828 Body mass index (BMI) 28.0-28.9, adult: Secondary | ICD-10-CM | POA: Diagnosis not present

## 2017-10-22 DIAGNOSIS — E7800 Pure hypercholesterolemia, unspecified: Secondary | ICD-10-CM | POA: Diagnosis not present

## 2017-10-22 DIAGNOSIS — Z1389 Encounter for screening for other disorder: Secondary | ICD-10-CM | POA: Diagnosis not present

## 2017-10-22 DIAGNOSIS — I10 Essential (primary) hypertension: Secondary | ICD-10-CM | POA: Diagnosis not present

## 2017-12-03 DIAGNOSIS — M8588 Other specified disorders of bone density and structure, other site: Secondary | ICD-10-CM | POA: Diagnosis not present

## 2017-12-03 DIAGNOSIS — M810 Age-related osteoporosis without current pathological fracture: Secondary | ICD-10-CM | POA: Diagnosis not present

## 2018-02-01 DIAGNOSIS — Z23 Encounter for immunization: Secondary | ICD-10-CM | POA: Diagnosis not present

## 2018-04-23 DIAGNOSIS — Z6828 Body mass index (BMI) 28.0-28.9, adult: Secondary | ICD-10-CM | POA: Diagnosis not present

## 2018-04-23 DIAGNOSIS — Z8673 Personal history of transient ischemic attack (TIA), and cerebral infarction without residual deficits: Secondary | ICD-10-CM | POA: Diagnosis not present

## 2018-04-23 DIAGNOSIS — I10 Essential (primary) hypertension: Secondary | ICD-10-CM | POA: Diagnosis not present

## 2018-04-23 DIAGNOSIS — E7800 Pure hypercholesterolemia, unspecified: Secondary | ICD-10-CM | POA: Diagnosis not present

## 2018-04-23 DIAGNOSIS — I499 Cardiac arrhythmia, unspecified: Secondary | ICD-10-CM | POA: Diagnosis not present

## 2018-04-23 DIAGNOSIS — I493 Ventricular premature depolarization: Secondary | ICD-10-CM | POA: Diagnosis not present

## 2018-04-23 DIAGNOSIS — M810 Age-related osteoporosis without current pathological fracture: Secondary | ICD-10-CM | POA: Diagnosis not present

## 2018-04-24 DIAGNOSIS — I498 Other specified cardiac arrhythmias: Secondary | ICD-10-CM | POA: Diagnosis not present

## 2018-04-24 DIAGNOSIS — I10 Essential (primary) hypertension: Secondary | ICD-10-CM | POA: Diagnosis not present

## 2018-04-25 ENCOUNTER — Other Ambulatory Visit (HOSPITAL_COMMUNITY): Payer: Self-pay | Admitting: Family Medicine

## 2018-04-25 DIAGNOSIS — I498 Other specified cardiac arrhythmias: Secondary | ICD-10-CM

## 2018-04-26 ENCOUNTER — Other Ambulatory Visit: Payer: Self-pay

## 2018-04-26 ENCOUNTER — Ambulatory Visit (HOSPITAL_COMMUNITY): Payer: PPO | Attending: Internal Medicine

## 2018-04-26 DIAGNOSIS — Z8673 Personal history of transient ischemic attack (TIA), and cerebral infarction without residual deficits: Secondary | ICD-10-CM | POA: Diagnosis not present

## 2018-04-26 DIAGNOSIS — I498 Other specified cardiac arrhythmias: Secondary | ICD-10-CM | POA: Diagnosis not present

## 2018-04-26 DIAGNOSIS — E785 Hyperlipidemia, unspecified: Secondary | ICD-10-CM | POA: Diagnosis not present

## 2018-04-26 DIAGNOSIS — R008 Other abnormalities of heart beat: Secondary | ICD-10-CM | POA: Diagnosis not present

## 2018-04-26 DIAGNOSIS — I10 Essential (primary) hypertension: Secondary | ICD-10-CM | POA: Diagnosis not present

## 2018-06-05 DIAGNOSIS — H26492 Other secondary cataract, left eye: Secondary | ICD-10-CM | POA: Diagnosis not present

## 2018-06-05 DIAGNOSIS — H35372 Puckering of macula, left eye: Secondary | ICD-10-CM | POA: Diagnosis not present

## 2018-06-05 DIAGNOSIS — Z961 Presence of intraocular lens: Secondary | ICD-10-CM | POA: Diagnosis not present

## 2018-06-05 DIAGNOSIS — H52203 Unspecified astigmatism, bilateral: Secondary | ICD-10-CM | POA: Diagnosis not present

## 2018-12-09 DIAGNOSIS — S86111A Strain of other muscle(s) and tendon(s) of posterior muscle group at lower leg level, right leg, initial encounter: Secondary | ICD-10-CM | POA: Diagnosis not present

## 2018-12-24 DIAGNOSIS — L570 Actinic keratosis: Secondary | ICD-10-CM | POA: Diagnosis not present

## 2018-12-24 DIAGNOSIS — D229 Melanocytic nevi, unspecified: Secondary | ICD-10-CM | POA: Diagnosis not present

## 2018-12-24 DIAGNOSIS — I872 Venous insufficiency (chronic) (peripheral): Secondary | ICD-10-CM | POA: Diagnosis not present

## 2019-01-07 DIAGNOSIS — M79661 Pain in right lower leg: Secondary | ICD-10-CM | POA: Diagnosis not present

## 2019-01-07 DIAGNOSIS — I10 Essential (primary) hypertension: Secondary | ICD-10-CM | POA: Diagnosis not present

## 2019-01-07 DIAGNOSIS — Z8673 Personal history of transient ischemic attack (TIA), and cerebral infarction without residual deficits: Secondary | ICD-10-CM | POA: Diagnosis not present

## 2019-01-07 DIAGNOSIS — Z0000 Encounter for general adult medical examination without abnormal findings: Secondary | ICD-10-CM | POA: Diagnosis not present

## 2019-01-07 DIAGNOSIS — M810 Age-related osteoporosis without current pathological fracture: Secondary | ICD-10-CM | POA: Diagnosis not present

## 2019-01-07 DIAGNOSIS — E7800 Pure hypercholesterolemia, unspecified: Secondary | ICD-10-CM | POA: Diagnosis not present

## 2019-01-07 DIAGNOSIS — N401 Enlarged prostate with lower urinary tract symptoms: Secondary | ICD-10-CM | POA: Diagnosis not present

## 2019-01-24 DIAGNOSIS — Z0000 Encounter for general adult medical examination without abnormal findings: Secondary | ICD-10-CM | POA: Diagnosis not present

## 2019-01-24 DIAGNOSIS — M79661 Pain in right lower leg: Secondary | ICD-10-CM | POA: Diagnosis not present

## 2019-01-24 DIAGNOSIS — Z8673 Personal history of transient ischemic attack (TIA), and cerebral infarction without residual deficits: Secondary | ICD-10-CM | POA: Diagnosis not present

## 2019-01-24 DIAGNOSIS — I10 Essential (primary) hypertension: Secondary | ICD-10-CM | POA: Diagnosis not present

## 2019-01-24 DIAGNOSIS — N401 Enlarged prostate with lower urinary tract symptoms: Secondary | ICD-10-CM | POA: Diagnosis not present

## 2019-01-24 DIAGNOSIS — E7800 Pure hypercholesterolemia, unspecified: Secondary | ICD-10-CM | POA: Diagnosis not present

## 2019-01-24 DIAGNOSIS — M859 Disorder of bone density and structure, unspecified: Secondary | ICD-10-CM | POA: Diagnosis not present

## 2019-01-24 DIAGNOSIS — Z23 Encounter for immunization: Secondary | ICD-10-CM | POA: Diagnosis not present

## 2019-05-15 DIAGNOSIS — N401 Enlarged prostate with lower urinary tract symptoms: Secondary | ICD-10-CM | POA: Diagnosis not present

## 2019-05-15 DIAGNOSIS — E7800 Pure hypercholesterolemia, unspecified: Secondary | ICD-10-CM | POA: Diagnosis not present

## 2019-05-15 DIAGNOSIS — M810 Age-related osteoporosis without current pathological fracture: Secondary | ICD-10-CM | POA: Diagnosis not present

## 2019-05-15 DIAGNOSIS — I10 Essential (primary) hypertension: Secondary | ICD-10-CM | POA: Diagnosis not present

## 2019-06-24 DIAGNOSIS — N401 Enlarged prostate with lower urinary tract symptoms: Secondary | ICD-10-CM | POA: Diagnosis not present

## 2019-06-24 DIAGNOSIS — M810 Age-related osteoporosis without current pathological fracture: Secondary | ICD-10-CM | POA: Diagnosis not present

## 2019-06-24 DIAGNOSIS — E7800 Pure hypercholesterolemia, unspecified: Secondary | ICD-10-CM | POA: Diagnosis not present

## 2019-06-24 DIAGNOSIS — I10 Essential (primary) hypertension: Secondary | ICD-10-CM | POA: Diagnosis not present

## 2019-08-04 DIAGNOSIS — H02055 Trichiasis without entropian left lower eyelid: Secondary | ICD-10-CM | POA: Diagnosis not present

## 2019-08-04 DIAGNOSIS — H26492 Other secondary cataract, left eye: Secondary | ICD-10-CM | POA: Diagnosis not present

## 2019-08-04 DIAGNOSIS — H52203 Unspecified astigmatism, bilateral: Secondary | ICD-10-CM | POA: Diagnosis not present

## 2019-08-04 DIAGNOSIS — H40013 Open angle with borderline findings, low risk, bilateral: Secondary | ICD-10-CM | POA: Diagnosis not present

## 2019-09-11 DIAGNOSIS — H26492 Other secondary cataract, left eye: Secondary | ICD-10-CM | POA: Diagnosis not present

## 2019-09-11 DIAGNOSIS — H26491 Other secondary cataract, right eye: Secondary | ICD-10-CM | POA: Diagnosis not present

## 2019-11-17 DIAGNOSIS — I10 Essential (primary) hypertension: Secondary | ICD-10-CM | POA: Diagnosis not present

## 2019-11-17 DIAGNOSIS — M810 Age-related osteoporosis without current pathological fracture: Secondary | ICD-10-CM | POA: Diagnosis not present

## 2019-11-17 DIAGNOSIS — N401 Enlarged prostate with lower urinary tract symptoms: Secondary | ICD-10-CM | POA: Diagnosis not present

## 2019-11-17 DIAGNOSIS — E7800 Pure hypercholesterolemia, unspecified: Secondary | ICD-10-CM | POA: Diagnosis not present

## 2020-01-26 ENCOUNTER — Ambulatory Visit: Payer: PPO | Admitting: Dermatology

## 2020-03-29 ENCOUNTER — Ambulatory Visit: Payer: PPO | Admitting: Dermatology

## 2020-04-19 ENCOUNTER — Other Ambulatory Visit: Payer: Self-pay

## 2020-04-19 ENCOUNTER — Ambulatory Visit: Payer: PPO | Admitting: Dermatology

## 2020-04-19 ENCOUNTER — Encounter: Payer: Self-pay | Admitting: Dermatology

## 2020-04-19 DIAGNOSIS — Z87898 Personal history of other specified conditions: Secondary | ICD-10-CM

## 2020-04-19 DIAGNOSIS — Z86018 Personal history of other benign neoplasm: Secondary | ICD-10-CM | POA: Diagnosis not present

## 2020-04-19 DIAGNOSIS — Z85828 Personal history of other malignant neoplasm of skin: Secondary | ICD-10-CM | POA: Diagnosis not present

## 2020-04-19 DIAGNOSIS — D3617 Benign neoplasm of peripheral nerves and autonomic nervous system of trunk, unspecified: Secondary | ICD-10-CM | POA: Diagnosis not present

## 2020-04-19 DIAGNOSIS — D3610 Benign neoplasm of peripheral nerves and autonomic nervous system, unspecified: Secondary | ICD-10-CM

## 2020-04-19 DIAGNOSIS — D3612 Benign neoplasm of peripheral nerves and autonomic nervous system, upper limb, including shoulder: Secondary | ICD-10-CM | POA: Diagnosis not present

## 2020-04-19 DIAGNOSIS — Z1283 Encounter for screening for malignant neoplasm of skin: Secondary | ICD-10-CM

## 2020-04-19 DIAGNOSIS — D1800 Hemangioma unspecified site: Secondary | ICD-10-CM

## 2020-04-19 DIAGNOSIS — L570 Actinic keratosis: Secondary | ICD-10-CM | POA: Diagnosis not present

## 2020-04-19 DIAGNOSIS — L821 Other seborrheic keratosis: Secondary | ICD-10-CM

## 2020-04-21 ENCOUNTER — Encounter: Payer: Self-pay | Admitting: Dermatology

## 2020-04-21 NOTE — Progress Notes (Signed)
   Follow-Up Visit   Subjective  Patrick Spence is a 86 y.o. male who presents for the following: Annual Exam (Nose- scaly spot, cheeks- scaly spot and head scaly spot).  New crusts Location: Face and scalp Duration:  Quality:  Associated Signs/Symptoms: Modifying Factors:  Severity:  Timing: Context: Would like general skin check  Objective  Well appearing patient in no apparent distress; mood and affect are within normal limits. Objective  Right Breast: Waist up skin examination: No atypical moles, melanoma, nonmelanoma skin cancer.  Objective  Left Forearm - Anterior: Moderate atypical mole 2017- Left forearm- scar- clear  Objective  Left Wrist - Anterior: KA left wrist- 2009- white scar- clear  Objective  Left Upper Arm - Posterior, Left Upper Back, Right Upper Back: Soft pink 6 mm raised papule  Objective  Left Abdomen (side) - Upper: Multiple raised red papule  Objective  Right Lower Back: Several dozen 2 to 8 mm flattopped tan textured papules  Objective  Left Forehead, Left Malar Cheek (3), Mid Forehead, Mid Frontal Scalp, Right Malar Cheek, Right Zygomatic Area: 2 to 7 mm horny pink crusts    All skin waist up examined.   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; Left Upper Back; Right Upper Back  No need for removal if stable  Hemangioma, unspecified site Left Abdomen (side) - Upper  Okay to leave if stable  Seborrheic keratosis Right Lower Back  Okay to leave if stable  AK (actinic keratosis) (8) Mid Frontal Scalp; Mid Forehead; Right Zygomatic Area; Left Malar Cheek (3); Right Malar Cheek; Left  Forehead     Destruction of lesion - Left Forehead, Left Malar Cheek, Mid Forehead, Mid Frontal Scalp, Right Malar Cheek, Right Zygomatic Area Complexity: simple   Destruction method: cryotherapy   Informed consent: discussed and consent obtained   Timeout:  patient name, date of birth, surgical site, and procedure verified Lesion destroyed using liquid nitrogen: Yes   Cryotherapy cycles:  3 Outcome: patient tolerated procedure well with no complications        I, Lavonna Monarch, MD, have reviewed all documentation for this visit.  The documentation on 04/21/20 for the exam, diagnosis, procedures, and orders are all accurate and complete.

## 2020-05-27 DIAGNOSIS — E7800 Pure hypercholesterolemia, unspecified: Secondary | ICD-10-CM | POA: Diagnosis not present

## 2020-05-27 DIAGNOSIS — M810 Age-related osteoporosis without current pathological fracture: Secondary | ICD-10-CM | POA: Diagnosis not present

## 2020-05-27 DIAGNOSIS — I10 Essential (primary) hypertension: Secondary | ICD-10-CM | POA: Diagnosis not present

## 2020-05-27 DIAGNOSIS — N401 Enlarged prostate with lower urinary tract symptoms: Secondary | ICD-10-CM | POA: Diagnosis not present

## 2020-05-31 DIAGNOSIS — M810 Age-related osteoporosis without current pathological fracture: Secondary | ICD-10-CM | POA: Diagnosis not present

## 2020-05-31 DIAGNOSIS — E7800 Pure hypercholesterolemia, unspecified: Secondary | ICD-10-CM | POA: Diagnosis not present

## 2020-06-02 DIAGNOSIS — Z1389 Encounter for screening for other disorder: Secondary | ICD-10-CM | POA: Diagnosis not present

## 2020-06-02 DIAGNOSIS — Z0000 Encounter for general adult medical examination without abnormal findings: Secondary | ICD-10-CM | POA: Diagnosis not present

## 2020-07-21 DIAGNOSIS — H52203 Unspecified astigmatism, bilateral: Secondary | ICD-10-CM | POA: Diagnosis not present

## 2020-07-21 DIAGNOSIS — Z961 Presence of intraocular lens: Secondary | ICD-10-CM | POA: Diagnosis not present

## 2020-07-21 DIAGNOSIS — H35372 Puckering of macula, left eye: Secondary | ICD-10-CM | POA: Diagnosis not present

## 2020-08-09 DIAGNOSIS — Z6828 Body mass index (BMI) 28.0-28.9, adult: Secondary | ICD-10-CM | POA: Diagnosis not present

## 2020-08-09 DIAGNOSIS — N401 Enlarged prostate with lower urinary tract symptoms: Secondary | ICD-10-CM | POA: Diagnosis not present

## 2020-08-09 DIAGNOSIS — Z8673 Personal history of transient ischemic attack (TIA), and cerebral infarction without residual deficits: Secondary | ICD-10-CM | POA: Diagnosis not present

## 2020-08-09 DIAGNOSIS — M810 Age-related osteoporosis without current pathological fracture: Secondary | ICD-10-CM | POA: Diagnosis not present

## 2020-08-09 DIAGNOSIS — I10 Essential (primary) hypertension: Secondary | ICD-10-CM | POA: Diagnosis not present

## 2020-08-09 DIAGNOSIS — E7800 Pure hypercholesterolemia, unspecified: Secondary | ICD-10-CM | POA: Diagnosis not present

## 2020-09-24 DIAGNOSIS — M25512 Pain in left shoulder: Secondary | ICD-10-CM | POA: Diagnosis not present

## 2020-10-25 DIAGNOSIS — N401 Enlarged prostate with lower urinary tract symptoms: Secondary | ICD-10-CM | POA: Diagnosis not present

## 2020-10-25 DIAGNOSIS — I10 Essential (primary) hypertension: Secondary | ICD-10-CM | POA: Diagnosis not present

## 2020-10-25 DIAGNOSIS — E7800 Pure hypercholesterolemia, unspecified: Secondary | ICD-10-CM | POA: Diagnosis not present

## 2020-10-25 DIAGNOSIS — M810 Age-related osteoporosis without current pathological fracture: Secondary | ICD-10-CM | POA: Diagnosis not present

## 2020-10-26 DIAGNOSIS — U071 COVID-19: Secondary | ICD-10-CM | POA: Diagnosis not present

## 2020-12-06 DIAGNOSIS — Z6828 Body mass index (BMI) 28.0-28.9, adult: Secondary | ICD-10-CM | POA: Diagnosis not present

## 2020-12-06 DIAGNOSIS — M7542 Impingement syndrome of left shoulder: Secondary | ICD-10-CM | POA: Diagnosis not present

## 2020-12-06 DIAGNOSIS — R030 Elevated blood-pressure reading, without diagnosis of hypertension: Secondary | ICD-10-CM | POA: Diagnosis not present

## 2020-12-14 DIAGNOSIS — M6281 Muscle weakness (generalized): Secondary | ICD-10-CM | POA: Diagnosis not present

## 2020-12-14 DIAGNOSIS — M542 Cervicalgia: Secondary | ICD-10-CM | POA: Diagnosis not present

## 2020-12-14 DIAGNOSIS — M25512 Pain in left shoulder: Secondary | ICD-10-CM | POA: Diagnosis not present

## 2020-12-14 DIAGNOSIS — M25612 Stiffness of left shoulder, not elsewhere classified: Secondary | ICD-10-CM | POA: Diagnosis not present

## 2020-12-16 DIAGNOSIS — M6281 Muscle weakness (generalized): Secondary | ICD-10-CM | POA: Diagnosis not present

## 2020-12-16 DIAGNOSIS — M25512 Pain in left shoulder: Secondary | ICD-10-CM | POA: Diagnosis not present

## 2020-12-16 DIAGNOSIS — M542 Cervicalgia: Secondary | ICD-10-CM | POA: Diagnosis not present

## 2020-12-16 DIAGNOSIS — M25612 Stiffness of left shoulder, not elsewhere classified: Secondary | ICD-10-CM | POA: Diagnosis not present

## 2020-12-21 DIAGNOSIS — M6281 Muscle weakness (generalized): Secondary | ICD-10-CM | POA: Diagnosis not present

## 2020-12-21 DIAGNOSIS — M542 Cervicalgia: Secondary | ICD-10-CM | POA: Diagnosis not present

## 2020-12-21 DIAGNOSIS — M25512 Pain in left shoulder: Secondary | ICD-10-CM | POA: Diagnosis not present

## 2020-12-21 DIAGNOSIS — M25612 Stiffness of left shoulder, not elsewhere classified: Secondary | ICD-10-CM | POA: Diagnosis not present

## 2020-12-24 DIAGNOSIS — M25612 Stiffness of left shoulder, not elsewhere classified: Secondary | ICD-10-CM | POA: Diagnosis not present

## 2020-12-24 DIAGNOSIS — M6281 Muscle weakness (generalized): Secondary | ICD-10-CM | POA: Diagnosis not present

## 2020-12-24 DIAGNOSIS — M25512 Pain in left shoulder: Secondary | ICD-10-CM | POA: Diagnosis not present

## 2020-12-24 DIAGNOSIS — M542 Cervicalgia: Secondary | ICD-10-CM | POA: Diagnosis not present

## 2020-12-28 DIAGNOSIS — M25612 Stiffness of left shoulder, not elsewhere classified: Secondary | ICD-10-CM | POA: Diagnosis not present

## 2020-12-28 DIAGNOSIS — M25512 Pain in left shoulder: Secondary | ICD-10-CM | POA: Diagnosis not present

## 2020-12-28 DIAGNOSIS — M6281 Muscle weakness (generalized): Secondary | ICD-10-CM | POA: Diagnosis not present

## 2020-12-28 DIAGNOSIS — M542 Cervicalgia: Secondary | ICD-10-CM | POA: Diagnosis not present

## 2020-12-29 ENCOUNTER — Ambulatory Visit
Admission: RE | Admit: 2020-12-29 | Discharge: 2020-12-29 | Disposition: A | Discharge status: Home / Self Care | Payer: PPO | Source: Ambulatory Visit | Attending: Sports Medicine | Admitting: Sports Medicine

## 2020-12-29 ENCOUNTER — Other Ambulatory Visit: Payer: Self-pay | Admitting: Sports Medicine

## 2020-12-29 DIAGNOSIS — M25512 Pain in left shoulder: Secondary | ICD-10-CM

## 2020-12-29 DIAGNOSIS — M19012 Primary osteoarthritis, left shoulder: Secondary | ICD-10-CM | POA: Diagnosis not present

## 2020-12-30 DIAGNOSIS — M25512 Pain in left shoulder: Secondary | ICD-10-CM | POA: Diagnosis not present

## 2020-12-30 DIAGNOSIS — M6281 Muscle weakness (generalized): Secondary | ICD-10-CM | POA: Diagnosis not present

## 2020-12-30 DIAGNOSIS — M542 Cervicalgia: Secondary | ICD-10-CM | POA: Diagnosis not present

## 2020-12-30 DIAGNOSIS — M25612 Stiffness of left shoulder, not elsewhere classified: Secondary | ICD-10-CM | POA: Diagnosis not present

## 2021-01-04 DIAGNOSIS — M6281 Muscle weakness (generalized): Secondary | ICD-10-CM | POA: Diagnosis not present

## 2021-01-04 DIAGNOSIS — M542 Cervicalgia: Secondary | ICD-10-CM | POA: Diagnosis not present

## 2021-01-04 DIAGNOSIS — M25612 Stiffness of left shoulder, not elsewhere classified: Secondary | ICD-10-CM | POA: Diagnosis not present

## 2021-01-04 DIAGNOSIS — M25512 Pain in left shoulder: Secondary | ICD-10-CM | POA: Diagnosis not present

## 2021-01-06 DIAGNOSIS — M25612 Stiffness of left shoulder, not elsewhere classified: Secondary | ICD-10-CM | POA: Diagnosis not present

## 2021-01-06 DIAGNOSIS — M542 Cervicalgia: Secondary | ICD-10-CM | POA: Diagnosis not present

## 2021-01-06 DIAGNOSIS — M6281 Muscle weakness (generalized): Secondary | ICD-10-CM | POA: Diagnosis not present

## 2021-01-06 DIAGNOSIS — M25512 Pain in left shoulder: Secondary | ICD-10-CM | POA: Diagnosis not present

## 2021-01-11 DIAGNOSIS — M25512 Pain in left shoulder: Secondary | ICD-10-CM | POA: Diagnosis not present

## 2021-01-11 DIAGNOSIS — M25612 Stiffness of left shoulder, not elsewhere classified: Secondary | ICD-10-CM | POA: Diagnosis not present

## 2021-01-11 DIAGNOSIS — M542 Cervicalgia: Secondary | ICD-10-CM | POA: Diagnosis not present

## 2021-01-11 DIAGNOSIS — M6281 Muscle weakness (generalized): Secondary | ICD-10-CM | POA: Diagnosis not present

## 2021-01-13 DIAGNOSIS — M6281 Muscle weakness (generalized): Secondary | ICD-10-CM | POA: Diagnosis not present

## 2021-01-13 DIAGNOSIS — M25512 Pain in left shoulder: Secondary | ICD-10-CM | POA: Diagnosis not present

## 2021-01-13 DIAGNOSIS — M542 Cervicalgia: Secondary | ICD-10-CM | POA: Diagnosis not present

## 2021-01-13 DIAGNOSIS — M25612 Stiffness of left shoulder, not elsewhere classified: Secondary | ICD-10-CM | POA: Diagnosis not present

## 2021-03-22 ENCOUNTER — Encounter: Payer: Self-pay | Admitting: Dermatology

## 2021-03-22 ENCOUNTER — Other Ambulatory Visit: Payer: Self-pay

## 2021-03-22 ENCOUNTER — Ambulatory Visit: Payer: PPO | Admitting: Dermatology

## 2021-03-22 DIAGNOSIS — B353 Tinea pedis: Secondary | ICD-10-CM | POA: Diagnosis not present

## 2021-03-22 DIAGNOSIS — D692 Other nonthrombocytopenic purpura: Secondary | ICD-10-CM | POA: Diagnosis not present

## 2021-03-22 NOTE — Patient Instructions (Signed)
Over the counter Dermend

## 2021-04-17 ENCOUNTER — Encounter: Payer: Self-pay | Admitting: Dermatology

## 2021-04-17 NOTE — Progress Notes (Signed)
   Follow-Up Visit   Subjective  Patrick Spence is a 87 y.o. male who presents for the following: Skin Problem (New lesion on right wrist x 1 week ago- ? Blood blister).  New dark spot on right wrist plus several other areas to check Location:  Duration:  Quality:  Associated Signs/Symptoms: Modifying Factors:  Severity:  Timing: Context:   Objective  Well appearing patient in no apparent distress; mood and affect are within normal limits. Left Arm, Right Arm   Purpuric 1.1 cm macule; 2 smaller forearm spots and no history of abnormal bleeding  Left Foot - Anterior Marginated chronic dermatitis, will tryover-the-counter clotrimazole daily for 3 weeks after bathing    A focused examination was performed including head, neck, back, arms, legs. Relevant physical exam findings are noted in the Assessment and Plan.   Assessment & Plan    Solar purpura ([place]) (2) Left Arm; Right Arm  Over the counter Dermend; recheck if/not restless not clear in the next Month or so  Tinea pedis of left foot Left Foot - Anterior  Tinea pedis of right foot Right Foot - Anterior      I, Lavonna Monarch, MD, have reviewed all documentation for this visit.  The documentation on 04/17/21 for the exam, diagnosis, procedures, and orders are all accurate and complete.

## 2021-05-06 ENCOUNTER — Emergency Department (HOSPITAL_COMMUNITY)
Admission: EM | Admit: 2021-05-06 | Discharge: 2021-05-06 | Disposition: A | Discharge status: Home / Self Care | Payer: PPO | Attending: Emergency Medicine | Admitting: Emergency Medicine

## 2021-05-06 ENCOUNTER — Encounter (HOSPITAL_COMMUNITY): Payer: Self-pay

## 2021-05-06 ENCOUNTER — Emergency Department (HOSPITAL_COMMUNITY): Payer: PPO

## 2021-05-06 ENCOUNTER — Other Ambulatory Visit: Payer: Self-pay

## 2021-05-06 DIAGNOSIS — Z7982 Long term (current) use of aspirin: Secondary | ICD-10-CM | POA: Insufficient documentation

## 2021-05-06 DIAGNOSIS — R42 Dizziness and giddiness: Secondary | ICD-10-CM | POA: Insufficient documentation

## 2021-05-06 DIAGNOSIS — I10 Essential (primary) hypertension: Secondary | ICD-10-CM | POA: Insufficient documentation

## 2021-05-06 DIAGNOSIS — Z79899 Other long term (current) drug therapy: Secondary | ICD-10-CM | POA: Insufficient documentation

## 2021-05-06 DIAGNOSIS — M19012 Primary osteoarthritis, left shoulder: Secondary | ICD-10-CM | POA: Diagnosis not present

## 2021-05-06 DIAGNOSIS — Z96642 Presence of left artificial hip joint: Secondary | ICD-10-CM | POA: Insufficient documentation

## 2021-05-06 DIAGNOSIS — M25519 Pain in unspecified shoulder: Secondary | ICD-10-CM | POA: Diagnosis not present

## 2021-05-06 DIAGNOSIS — M25512 Pain in left shoulder: Secondary | ICD-10-CM | POA: Diagnosis not present

## 2021-05-06 DIAGNOSIS — R531 Weakness: Secondary | ICD-10-CM | POA: Diagnosis not present

## 2021-05-06 NOTE — ED Provider Notes (Signed)
[facility] DEPT Provider Note   CSN: [id number] Arrival date & time: 05/06/21  6295     History Chief Complaint  Patient presents with   Shoulder Pain    Patrick Spence is a 87 y.o. male.  87 year old male with prior medical history as detailed below presents for evaluation.  Patient reports prior history of left shoulder pain.  This improved after physical therapy earlier this year.  Patient reports that he had a steroid injection to the shoulder done as well.  Patient reports that last night he was sleeping on his left shoulder.  He developed sudden onset of sharp pain in the left shoulder.  During the worst of his pain he attempted to stand.  He felt woozy and a little lightheaded.  He thinks that his symptoms were secondary to the pain.  His pain resolved after approximately 20 minutes.  He denies any feelings of weakness now.  He denies associated chest pain or shortness of breath.  He denies fever.  He is ambulatory now without difficulty.  Patient does report prior episodes of likely vasovagal lightheadedness with painful stimulation.  He denies any significant recent inciting injury or use of the left shoulder.  He does report that he was sleeping on his left shoulder in a position that could cause discomfort.  The history is provided by the patient.  Shoulder Pain Location:  Shoulder Shoulder location:  L shoulder Injury: no   Pain details:    Quality:  Aching   Radiates to:  Does not radiate   Severity:  No pain   Onset quality:  Sudden   Duration:  1 hour   Timing:  Rare   Progression:  Resolved Dislocation: no   Foreign body present:  No foreign bodies Relieved by:  Nothing Worsened by:  Nothing     Past Medical History:  Diagnosis Date   Anxiety    Arthritis    Depression    HLD (hyperlipidemia)    Hypertension    Mini stroke    NO RESIDULE PROBLEMS   Osteopenia    Primary hyperparathyroidism ([place])     Patient  Active Problem List   Diagnosis Date Noted   Hyperparathyroidism, primary ([place]) 07/18/2012   Hypercalcemia 06/26/2012    Past Surgical History:  Procedure Laterality Date   JOINT REPLACEMENT     L TOTAL HIP   PARATHYROIDECTOMY N/A 08/19/2012   Procedure: PARATHYROIDECTOMY and neck exploration ;  Surgeon: Earnstine Regal, MD;  Location: [facility] ORS;  Service: General;  Laterality: N/A;   PELVIS FRACTURE     TONSILLECTOMY     WRIST SURGERY         Family History  Problem Relation Age of Onset   Cancer Father        stomach   Cancer Mother        lung    Social History   Tobacco Use   Smoking status: Never   Smokeless tobacco: Never  Vaping Use   Vaping Use: Never used  Substance Use Topics   Alcohol use: Yes    Comment: 1 GLASS WINE NIGHTLY   Drug use: No    Home Medications Prior to Admission medications   Medication Sig Start Date End Date Taking? Authorizing Provider  alendronate (FOSAMAX) 70 MG tablet Take 70 mg by mouth every 7 (seven) days. Take with a full glass of water on an empty stomach.    [provider]  aspirin 325 MG tablet  Take 325 mg by mouth daily.    [provider]  Calcium Carbonate-Vitamin D (CALCIUM 600 + D PO) Take 1 tablet by mouth 2 (two) times daily.    [provider]  fish oil-omega-3 fatty acids 1000 MG capsule Take 1 g by mouth daily.     [provider]  hydrochlorothiazide (MICROZIDE) 12.5 MG capsule Take 12.5 mg by mouth daily.    [provider]  HYDROcodone-acetaminophen (NORCO/VICODIN) 5-325 MG per tablet Take 1-2 tablets by mouth every 4 (four) hours as needed for pain. 08/20/12   Armandina Gemma, MD  losartan (COZAAR) 50 MG tablet Take 50 mg by mouth every morning.     [provider]  Multiple Vitamins-Minerals (CENTRUM SILVER ADULT 50+ PO) Take 1 tablet by mouth daily.     [provider]  niacin 500 MG CR capsule Take 500 mg by mouth at bedtime.    [provider]   OVER THE COUNTER MEDICATION Take 1 tablet by mouth daily. Glucosamine 2000 mg    [provider]  simvastatin (ZOCOR) 10 MG tablet Take 10 mg by mouth at bedtime.    [provider]  tamsulosin (FLOMAX) 0.4 MG CAPS capsule Take 0.4 mg by mouth.    [provider]    Allergies    Patient has no known allergies.  Review of Systems   Review of Systems  All other systems reviewed and are negative.  Physical Exam Updated Vital Signs BP 138/86 (BP Location: Right Arm)    Pulse 89    Temp 98.4 F (36.9 C) (Oral)    Resp 18    Ht 5\' 6"  (1.676 m)    Wt 81.6 kg    SpO2 96%    BMI 29.05 kg/m   Physical Exam Vitals and nursing note reviewed.  Constitutional:      General: He is not in acute distress.    Appearance: Normal appearance. He is well-developed.  HENT:     Head: Normocephalic and atraumatic.  Eyes:     Conjunctiva/sclera: Conjunctivae normal.     Pupils: Pupils are equal, round, and reactive to light.  Cardiovascular:     Rate and Rhythm: Normal rate and regular rhythm.     Heart sounds: Normal heart sounds.  Pulmonary:     Effort: Pulmonary effort is normal. No respiratory distress.     Breath sounds: Normal breath sounds.  Abdominal:     General: There is no distension.     Palpations: Abdomen is soft.     Tenderness: There is no abdominal tenderness.  Musculoskeletal:        General: No swelling, tenderness, deformity or signs of injury. Normal range of motion.     Cervical back: Normal range of motion and neck supple.     Right lower leg: No edema.     Left lower leg: No edema.  Skin:    General: Skin is warm and dry.  Neurological:     General: No focal deficit present.     Mental Status: He is alert and oriented to person, place, and time. Mental status is at baseline.     Cranial Nerves: No cranial nerve deficit.     Sensory: No sensory deficit.     Motor: No weakness.     Coordination: Coordination normal.     Gait: Gait normal.     ED Results / Procedures / Treatments   Labs (all labs ordered are listed, but only abnormal results  are displayed) Labs Reviewed - No data to display  EKG EKG Interpretation  Date/Time:  Friday May 06 2021 10:21:37 EST Ventricular Rate:  87 PR Interval:  238 QRS Duration: 104 QT Interval:  382 QTC Calculation: 459 R Axis:   -30 Text Interpretation: Sinus rhythm with 1st degree A-V block Left axis deviation Abnormal ECG When compared with ECG of 12-Aug-2012 13:44, No significant change since last tracing Confirmed by Dene Gentry [phone]) on 05/06/2021 10:25:45 AM  Radiology DG Shoulder Left  Result Date: 05/06/2021 CLINICAL DATA:  Acute left shoulder pain. EXAM: LEFT SHOULDER - 2+ VIEW COMPARISON:  December 29, 2020. FINDINGS: There is no evidence of fracture or dislocation. Moderate degenerative changes seen involving the left acromioclavicular joint. Soft tissues are unremarkable. 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 No acute abnormality seen. Electronically Signed   By: Marijo Conception M.D.   On: 05/06/2021 08:28    Procedures Procedures   Medications Ordered in ED Medications - No data to display  ED Course  I have reviewed the triage vital signs and the nursing notes.  Pertinent labs & imaging results that were available during my care of the patient were reviewed by me and considered in my medical decision making (see chart for details).    MDM Rules/Calculators/A&P                         MDM  MSE complete  AHSAN ESTERLINE was evaluated in Emergency Department on 05/06/2021 for the symptoms described in the history of present illness. He was evaluated in the context of the global COVID-19 pandemic, which necessitated consideration that the patient might be at risk for infection with the SARS-CoV-2 virus that causes COVID-19. Institutional protocols and algorithms that pertain to the evaluation of patients at  risk for COVID-19 are in a state of rapid change based on information released by regulatory bodies including the CDC and federal and state organizations. These policies and algorithms were followed during the patient's care in the ED.   Patient presented with complaint of transient left shoulder discomfort.  Patient's pain appears to have been associated with position that he was sleeping on his shoulder.  He is without current complaint of pain in the shoulder.  X-rays obtained and reviewed by myself are without evidence of significant acute pathology.  Patient reported transient lightheadedness associated with trying to stand while he was experiencing significant discomfort into the left shoulder.  Patient with prior history of vasovagal like episodes in the past.  Patient is reported feelings of lightheadedness most likely related to vasovagal episode related to his left shoulder pain.  Patient without other concerning findings such as chest pain, shortness of breath, diaphoresis, nausea, vomiting, neurologic abnormality.  Screening EKG obtained is without significant acute abnormality.  Patient is comfortable at time of discharge.  He does understand need for close follow-up.  Strict return precautions given and understood.     Final Clinical Impression(s) / ED Diagnoses Final diagnoses:  Acute pain of left shoulder    Rx / DC Orders ED Discharge Orders     None        Valarie Merino, MD 05/06/21 1028

## 2021-05-06 NOTE — Discharge Instructions (Signed)
Return for any problem.  Follow-up closely with your regular care provider regarding your left shoulder discomfort.  Work-up and exam today are without evidence of other significant pathology.  Obtained EKG is without significant acute abnormality.

## 2021-05-06 NOTE — ED Triage Notes (Addendum)
Per EMS- Patient c/o left shoulder pain since 0300. Patient states he has been getting physical therapy earlier this year for the left shoulder.

## 2021-05-10 DIAGNOSIS — M25512 Pain in left shoulder: Secondary | ICD-10-CM | POA: Diagnosis not present

## 2021-05-10 DIAGNOSIS — M542 Cervicalgia: Secondary | ICD-10-CM | POA: Diagnosis not present

## 2021-05-17 DIAGNOSIS — I10 Essential (primary) hypertension: Secondary | ICD-10-CM | POA: Diagnosis not present

## 2021-05-17 DIAGNOSIS — N401 Enlarged prostate with lower urinary tract symptoms: Secondary | ICD-10-CM | POA: Diagnosis not present

## 2021-05-17 DIAGNOSIS — M810 Age-related osteoporosis without current pathological fracture: Secondary | ICD-10-CM | POA: Diagnosis not present

## 2021-05-17 DIAGNOSIS — E7800 Pure hypercholesterolemia, unspecified: Secondary | ICD-10-CM | POA: Diagnosis not present

## 2021-05-25 DIAGNOSIS — M25512 Pain in left shoulder: Secondary | ICD-10-CM | POA: Diagnosis not present

## 2021-06-02 DIAGNOSIS — R600 Localized edema: Secondary | ICD-10-CM | POA: Diagnosis not present

## 2021-06-02 DIAGNOSIS — M25612 Stiffness of left shoulder, not elsewhere classified: Secondary | ICD-10-CM | POA: Diagnosis not present

## 2021-06-02 DIAGNOSIS — M25512 Pain in left shoulder: Secondary | ICD-10-CM | POA: Diagnosis not present

## 2021-06-07 DIAGNOSIS — I10 Essential (primary) hypertension: Secondary | ICD-10-CM | POA: Diagnosis not present

## 2021-06-07 DIAGNOSIS — Z0000 Encounter for general adult medical examination without abnormal findings: Secondary | ICD-10-CM | POA: Diagnosis not present

## 2021-06-07 DIAGNOSIS — M25612 Stiffness of left shoulder, not elsewhere classified: Secondary | ICD-10-CM | POA: Diagnosis not present

## 2021-06-07 DIAGNOSIS — M810 Age-related osteoporosis without current pathological fracture: Secondary | ICD-10-CM | POA: Diagnosis not present

## 2021-06-07 DIAGNOSIS — R600 Localized edema: Secondary | ICD-10-CM | POA: Diagnosis not present

## 2021-06-07 DIAGNOSIS — N401 Enlarged prostate with lower urinary tract symptoms: Secondary | ICD-10-CM | POA: Diagnosis not present

## 2021-06-07 DIAGNOSIS — Z1389 Encounter for screening for other disorder: Secondary | ICD-10-CM | POA: Diagnosis not present

## 2021-06-07 DIAGNOSIS — M25512 Pain in left shoulder: Secondary | ICD-10-CM | POA: Diagnosis not present

## 2021-06-07 DIAGNOSIS — E7800 Pure hypercholesterolemia, unspecified: Secondary | ICD-10-CM | POA: Diagnosis not present

## 2021-06-07 DIAGNOSIS — Z8673 Personal history of transient ischemic attack (TIA), and cerebral infarction without residual deficits: Secondary | ICD-10-CM | POA: Diagnosis not present

## 2021-06-09 DIAGNOSIS — M25512 Pain in left shoulder: Secondary | ICD-10-CM | POA: Diagnosis not present

## 2021-06-09 DIAGNOSIS — M25612 Stiffness of left shoulder, not elsewhere classified: Secondary | ICD-10-CM | POA: Diagnosis not present

## 2021-06-14 DIAGNOSIS — M25612 Stiffness of left shoulder, not elsewhere classified: Secondary | ICD-10-CM | POA: Diagnosis not present

## 2021-06-14 DIAGNOSIS — M25512 Pain in left shoulder: Secondary | ICD-10-CM | POA: Diagnosis not present

## 2021-06-16 DIAGNOSIS — M25512 Pain in left shoulder: Secondary | ICD-10-CM | POA: Diagnosis not present

## 2021-06-16 DIAGNOSIS — M25612 Stiffness of left shoulder, not elsewhere classified: Secondary | ICD-10-CM | POA: Diagnosis not present

## 2021-06-22 DIAGNOSIS — M25512 Pain in left shoulder: Secondary | ICD-10-CM | POA: Diagnosis not present

## 2021-07-05 DIAGNOSIS — S79912A Unspecified injury of left hip, initial encounter: Secondary | ICD-10-CM | POA: Diagnosis not present

## 2021-07-05 DIAGNOSIS — W19XXXA Unspecified fall, initial encounter: Secondary | ICD-10-CM | POA: Diagnosis not present

## 2021-07-12 DIAGNOSIS — R600 Localized edema: Secondary | ICD-10-CM | POA: Diagnosis not present

## 2021-07-12 DIAGNOSIS — S7002XD Contusion of left hip, subsequent encounter: Secondary | ICD-10-CM | POA: Diagnosis not present

## 2021-07-12 DIAGNOSIS — I10 Essential (primary) hypertension: Secondary | ICD-10-CM | POA: Diagnosis not present

## 2021-07-20 DIAGNOSIS — M898X1 Other specified disorders of bone, shoulder: Secondary | ICD-10-CM | POA: Diagnosis not present

## 2021-07-20 DIAGNOSIS — M542 Cervicalgia: Secondary | ICD-10-CM | POA: Diagnosis not present

## 2021-07-22 ENCOUNTER — Other Ambulatory Visit: Payer: Self-pay | Admitting: Sports Medicine

## 2021-07-22 DIAGNOSIS — M542 Cervicalgia: Secondary | ICD-10-CM

## 2021-07-27 DIAGNOSIS — M25512 Pain in left shoulder: Secondary | ICD-10-CM | POA: Diagnosis not present

## 2021-07-28 DIAGNOSIS — M7542 Impingement syndrome of left shoulder: Secondary | ICD-10-CM | POA: Diagnosis not present

## 2021-07-28 DIAGNOSIS — M25512 Pain in left shoulder: Secondary | ICD-10-CM | POA: Diagnosis not present

## 2021-07-30 ENCOUNTER — Ambulatory Visit
Admission: RE | Admit: 2021-07-30 | Discharge: 2021-07-30 | Disposition: A | Discharge status: Home / Self Care | Payer: PPO | Source: Ambulatory Visit | Attending: Sports Medicine | Admitting: Sports Medicine

## 2021-07-30 DIAGNOSIS — M4802 Spinal stenosis, cervical region: Secondary | ICD-10-CM | POA: Diagnosis not present

## 2021-07-30 DIAGNOSIS — M542 Cervicalgia: Secondary | ICD-10-CM

## 2021-08-02 DIAGNOSIS — Z961 Presence of intraocular lens: Secondary | ICD-10-CM | POA: Diagnosis not present

## 2021-08-02 DIAGNOSIS — H35372 Puckering of macula, left eye: Secondary | ICD-10-CM | POA: Diagnosis not present

## 2021-08-02 DIAGNOSIS — H02052 Trichiasis without entropian right lower eyelid: Secondary | ICD-10-CM | POA: Diagnosis not present

## 2021-08-02 DIAGNOSIS — H52203 Unspecified astigmatism, bilateral: Secondary | ICD-10-CM | POA: Diagnosis not present

## 2021-08-03 DIAGNOSIS — M25512 Pain in left shoulder: Secondary | ICD-10-CM | POA: Diagnosis not present

## 2021-08-03 DIAGNOSIS — M7542 Impingement syndrome of left shoulder: Secondary | ICD-10-CM | POA: Diagnosis not present

## 2021-08-04 DIAGNOSIS — M7542 Impingement syndrome of left shoulder: Secondary | ICD-10-CM | POA: Diagnosis not present

## 2021-08-04 DIAGNOSIS — M25512 Pain in left shoulder: Secondary | ICD-10-CM | POA: Diagnosis not present

## 2021-08-09 DIAGNOSIS — M25512 Pain in left shoulder: Secondary | ICD-10-CM | POA: Diagnosis not present

## 2021-08-09 DIAGNOSIS — M7542 Impingement syndrome of left shoulder: Secondary | ICD-10-CM | POA: Diagnosis not present

## 2021-08-11 DIAGNOSIS — M7542 Impingement syndrome of left shoulder: Secondary | ICD-10-CM | POA: Diagnosis not present

## 2021-08-11 DIAGNOSIS — M25512 Pain in left shoulder: Secondary | ICD-10-CM | POA: Diagnosis not present

## 2021-08-16 DIAGNOSIS — M25512 Pain in left shoulder: Secondary | ICD-10-CM | POA: Diagnosis not present

## 2021-08-16 DIAGNOSIS — M7542 Impingement syndrome of left shoulder: Secondary | ICD-10-CM | POA: Diagnosis not present

## 2021-08-18 DIAGNOSIS — M7542 Impingement syndrome of left shoulder: Secondary | ICD-10-CM | POA: Diagnosis not present

## 2021-08-18 DIAGNOSIS — M25512 Pain in left shoulder: Secondary | ICD-10-CM | POA: Diagnosis not present

## 2021-08-22 DIAGNOSIS — M75122 Complete rotator cuff tear or rupture of left shoulder, not specified as traumatic: Secondary | ICD-10-CM | POA: Diagnosis not present

## 2021-08-23 DIAGNOSIS — M7542 Impingement syndrome of left shoulder: Secondary | ICD-10-CM | POA: Diagnosis not present

## 2021-08-23 DIAGNOSIS — M25512 Pain in left shoulder: Secondary | ICD-10-CM | POA: Diagnosis not present

## 2021-08-25 DIAGNOSIS — M25512 Pain in left shoulder: Secondary | ICD-10-CM | POA: Diagnosis not present

## 2021-08-25 DIAGNOSIS — M7542 Impingement syndrome of left shoulder: Secondary | ICD-10-CM | POA: Diagnosis not present

## 2021-08-30 DIAGNOSIS — M25512 Pain in left shoulder: Secondary | ICD-10-CM | POA: Diagnosis not present

## 2021-08-30 DIAGNOSIS — M7542 Impingement syndrome of left shoulder: Secondary | ICD-10-CM | POA: Diagnosis not present

## 2021-09-01 DIAGNOSIS — M25512 Pain in left shoulder: Secondary | ICD-10-CM | POA: Diagnosis not present

## 2021-09-01 DIAGNOSIS — M7542 Impingement syndrome of left shoulder: Secondary | ICD-10-CM | POA: Diagnosis not present

## 2021-09-05 DIAGNOSIS — M25512 Pain in left shoulder: Secondary | ICD-10-CM | POA: Diagnosis not present

## 2021-09-05 DIAGNOSIS — M7542 Impingement syndrome of left shoulder: Secondary | ICD-10-CM | POA: Diagnosis not present

## 2021-09-08 DIAGNOSIS — M7542 Impingement syndrome of left shoulder: Secondary | ICD-10-CM | POA: Diagnosis not present

## 2021-09-08 DIAGNOSIS — M25512 Pain in left shoulder: Secondary | ICD-10-CM | POA: Diagnosis not present

## 2021-09-13 DIAGNOSIS — M7542 Impingement syndrome of left shoulder: Secondary | ICD-10-CM | POA: Diagnosis not present

## 2021-09-13 DIAGNOSIS — M25512 Pain in left shoulder: Secondary | ICD-10-CM | POA: Diagnosis not present

## 2021-09-15 DIAGNOSIS — M25512 Pain in left shoulder: Secondary | ICD-10-CM | POA: Diagnosis not present

## 2021-09-15 DIAGNOSIS — M7542 Impingement syndrome of left shoulder: Secondary | ICD-10-CM | POA: Diagnosis not present

## 2021-09-19 ENCOUNTER — Encounter: Payer: Self-pay | Admitting: Dermatology

## 2021-09-19 ENCOUNTER — Ambulatory Visit: Payer: PPO | Admitting: Dermatology

## 2021-09-19 DIAGNOSIS — Z85828 Personal history of other malignant neoplasm of skin: Secondary | ICD-10-CM

## 2021-09-19 DIAGNOSIS — D3612 Benign neoplasm of peripheral nerves and autonomic nervous system, upper limb, including shoulder: Secondary | ICD-10-CM | POA: Diagnosis not present

## 2021-09-19 DIAGNOSIS — L570 Actinic keratosis: Secondary | ICD-10-CM | POA: Diagnosis not present

## 2021-09-19 DIAGNOSIS — Z1283 Encounter for screening for malignant neoplasm of skin: Secondary | ICD-10-CM | POA: Diagnosis not present

## 2021-09-19 DIAGNOSIS — D485 Neoplasm of uncertain behavior of skin: Secondary | ICD-10-CM | POA: Diagnosis not present

## 2021-09-19 DIAGNOSIS — Z86018 Personal history of other benign neoplasm: Secondary | ICD-10-CM | POA: Diagnosis not present

## 2021-09-19 NOTE — Patient Instructions (Signed)

## 2021-09-20 DIAGNOSIS — M25512 Pain in left shoulder: Secondary | ICD-10-CM | POA: Diagnosis not present

## 2021-09-20 DIAGNOSIS — M7542 Impingement syndrome of left shoulder: Secondary | ICD-10-CM | POA: Diagnosis not present

## 2021-09-21 ENCOUNTER — Telehealth: Payer: Self-pay | Admitting: Dermatology

## 2021-09-21 DIAGNOSIS — M25512 Pain in left shoulder: Secondary | ICD-10-CM | POA: Diagnosis not present

## 2021-09-21 NOTE — Telephone Encounter (Signed)
Patient called to say that he went by pharmacy on Monday 09/19/2021 after appointment to pick up the prescription for Acyclovir, but the pharmacy had no record of a prescription being sent in.  Patient states he uses it for fever blisters. ? ?[id number] [place],  - [address]Ph: [phone] ?

## 2021-09-22 DIAGNOSIS — M25512 Pain in left shoulder: Secondary | ICD-10-CM | POA: Diagnosis not present

## 2021-09-22 DIAGNOSIS — M7542 Impingement syndrome of left shoulder: Secondary | ICD-10-CM | POA: Diagnosis not present

## 2021-09-26 MED ORDER — ACYCLOVIR 400 MG PO TABS: ORAL_TABLET | ORAL | 5 refills | Status: AC

## 2021-09-26 NOTE — Telephone Encounter (Signed)
Phone call to patient to let him know we called in his medication.

## 2021-09-26 NOTE — Addendum Note (Signed)
Addended by: Ailene Rud on: 09/26/2021 09:26 AM   Modules accepted: Orders

## 2021-09-26 NOTE — Telephone Encounter (Signed)
Patient left message on office voice mail wanting to know why he is having to wait to get the prescription for Acyclovir.  Patient states that it should have been sent in after his visit last Monday 09/19/2021.  Patient states that he is tired of waiting and the prescription should be sent in to    [id number]- [place], [address](Ph: [phone]

## 2021-10-05 ENCOUNTER — Encounter: Payer: Self-pay | Admitting: Dermatology

## 2021-10-05 NOTE — Progress Notes (Signed)
   Follow-Up Visit   Subjective  Patrick Spence is a 88 y.o. male who presents for the following: Annual Exam (Here for skin exam. Concerns scalp, cheek and face. History of non mole skin cancer and atypical moles. ).  General skin examination, several new crusts plus new spot on arm Location:  Duration:  Quality:  Associated Signs/Symptoms: Modifying Factors:  Severity:  Timing: Context:   Objective  Well appearing patient in no apparent distress; mood and affect are within normal limits. No atypical nevi (all checked with dermoscopy) or signs of NMSC noted at the time of the visit. Waist up exam keratosis on the torso and angiomas. Right forearm pink lesion of uncertain diagnosis lesion to biopsy today.  Right Forearm - Posterior Possibly new pink pearly 7 mm dermal papule.  Dermoscopy amorphous.  Rule out Merkel cell.     Head, Scalp Facial & scalp pink gritty 2 to 4 mm scale     All skin waist up examined.   Assessment & Plan    Screening exam for skin cancer  Annual skin examination  Neoplasm of uncertain behavior of skin Right Forearm - Posterior  Skin / nail biopsy Type of biopsy: tangential   Informed consent: discussed and consent obtained   Timeout: patient name, date of birth, surgical site, and procedure verified   Anesthesia: the lesion was anesthetized in a standard fashion   Anesthetic:  1% lidocaine w/ epinephrine 1-100,000 local infiltration Instrument used: flexible razor blade   Hemostasis achieved with: aluminum chloride and electrodesiccation   Outcome: patient tolerated procedure well   Post-procedure details: wound care instructions given    Specimen 1 - Surgical pathology Differential Diagnosis: bcc scc   Check Margins: No  AK (actinic keratosis) (2) Scalp; Head  Freezing of the 2 thicker lesions, consider PDT versus [place] next winter.  Destruction of lesion - Head, Scalp Complexity: simple   Destruction method: cryotherapy    Informed consent: discussed and consent obtained   Lesion destroyed using liquid nitrogen: Yes   Cryotherapy cycles:  5 Outcome: patient tolerated procedure well with no complications        I, Lavonna Monarch, MD, have reviewed all documentation for this visit.  The documentation on 10/05/21 for the exam, diagnosis, procedures, and orders are all accurate and complete.

## 2021-10-06 DIAGNOSIS — M7542 Impingement syndrome of left shoulder: Secondary | ICD-10-CM | POA: Diagnosis not present

## 2021-10-06 DIAGNOSIS — M25512 Pain in left shoulder: Secondary | ICD-10-CM | POA: Diagnosis not present

## 2021-10-12 DIAGNOSIS — M7542 Impingement syndrome of left shoulder: Secondary | ICD-10-CM | POA: Diagnosis not present

## 2021-10-12 DIAGNOSIS — M25512 Pain in left shoulder: Secondary | ICD-10-CM | POA: Diagnosis not present

## 2021-10-14 DIAGNOSIS — M25512 Pain in left shoulder: Secondary | ICD-10-CM | POA: Diagnosis not present

## 2021-10-14 DIAGNOSIS — M7542 Impingement syndrome of left shoulder: Secondary | ICD-10-CM | POA: Diagnosis not present

## 2021-10-18 DIAGNOSIS — M7542 Impingement syndrome of left shoulder: Secondary | ICD-10-CM | POA: Diagnosis not present

## 2021-10-18 DIAGNOSIS — M25512 Pain in left shoulder: Secondary | ICD-10-CM | POA: Diagnosis not present

## 2021-10-20 DIAGNOSIS — M7542 Impingement syndrome of left shoulder: Secondary | ICD-10-CM | POA: Diagnosis not present

## 2021-10-20 DIAGNOSIS — M25512 Pain in left shoulder: Secondary | ICD-10-CM | POA: Diagnosis not present

## 2021-11-30 DIAGNOSIS — M25512 Pain in left shoulder: Secondary | ICD-10-CM | POA: Diagnosis not present

## 2021-12-05 DIAGNOSIS — Z8673 Personal history of transient ischemic attack (TIA), and cerebral infarction without residual deficits: Secondary | ICD-10-CM | POA: Diagnosis not present

## 2021-12-05 DIAGNOSIS — M810 Age-related osteoporosis without current pathological fracture: Secondary | ICD-10-CM | POA: Diagnosis not present

## 2021-12-05 DIAGNOSIS — I10 Essential (primary) hypertension: Secondary | ICD-10-CM | POA: Diagnosis not present

## 2021-12-05 DIAGNOSIS — N401 Enlarged prostate with lower urinary tract symptoms: Secondary | ICD-10-CM | POA: Diagnosis not present

## 2021-12-05 DIAGNOSIS — Z6828 Body mass index (BMI) 28.0-28.9, adult: Secondary | ICD-10-CM | POA: Diagnosis not present

## 2021-12-05 DIAGNOSIS — E7800 Pure hypercholesterolemia, unspecified: Secondary | ICD-10-CM | POA: Diagnosis not present

## 2022-02-20 DIAGNOSIS — M25512 Pain in left shoulder: Secondary | ICD-10-CM | POA: Diagnosis not present

## 2022-05-06 DIAGNOSIS — R051 Acute cough: Secondary | ICD-10-CM | POA: Diagnosis not present

## 2022-05-06 DIAGNOSIS — J069 Acute upper respiratory infection, unspecified: Secondary | ICD-10-CM | POA: Diagnosis not present

## 2022-05-06 DIAGNOSIS — Z03818 Encounter for observation for suspected exposure to other biological agents ruled out: Secondary | ICD-10-CM | POA: Diagnosis not present

## 2022-05-06 DIAGNOSIS — B974 Respiratory syncytial virus as the cause of diseases classified elsewhere: Secondary | ICD-10-CM | POA: Diagnosis not present

## 2022-05-06 DIAGNOSIS — R0981 Nasal congestion: Secondary | ICD-10-CM | POA: Diagnosis not present

## 2022-05-09 DIAGNOSIS — R053 Chronic cough: Secondary | ICD-10-CM | POA: Diagnosis not present

## 2022-05-09 DIAGNOSIS — Z6827 Body mass index (BMI) 27.0-27.9, adult: Secondary | ICD-10-CM | POA: Diagnosis not present

## 2022-05-11 DIAGNOSIS — R079 Chest pain, unspecified: Secondary | ICD-10-CM | POA: Diagnosis not present

## 2022-05-11 DIAGNOSIS — R0789 Other chest pain: Secondary | ICD-10-CM | POA: Diagnosis not present

## 2022-06-12 DIAGNOSIS — Z1389 Encounter for screening for other disorder: Secondary | ICD-10-CM | POA: Diagnosis not present

## 2022-06-12 DIAGNOSIS — Z0000 Encounter for general adult medical examination without abnormal findings: Secondary | ICD-10-CM | POA: Diagnosis not present

## 2022-06-12 DIAGNOSIS — Z6828 Body mass index (BMI) 28.0-28.9, adult: Secondary | ICD-10-CM | POA: Diagnosis not present

## 2022-06-13 DIAGNOSIS — Z23 Encounter for immunization: Secondary | ICD-10-CM | POA: Diagnosis not present

## 2022-06-13 DIAGNOSIS — Z6828 Body mass index (BMI) 28.0-28.9, adult: Secondary | ICD-10-CM | POA: Diagnosis not present

## 2022-06-13 DIAGNOSIS — E7800 Pure hypercholesterolemia, unspecified: Secondary | ICD-10-CM | POA: Diagnosis not present

## 2022-06-13 DIAGNOSIS — Z8673 Personal history of transient ischemic attack (TIA), and cerebral infarction without residual deficits: Secondary | ICD-10-CM | POA: Diagnosis not present

## 2022-06-13 DIAGNOSIS — M810 Age-related osteoporosis without current pathological fracture: Secondary | ICD-10-CM | POA: Diagnosis not present

## 2022-06-13 DIAGNOSIS — I10 Essential (primary) hypertension: Secondary | ICD-10-CM | POA: Diagnosis not present

## 2022-08-23 DIAGNOSIS — Z961 Presence of intraocular lens: Secondary | ICD-10-CM | POA: Diagnosis not present

## 2022-08-23 DIAGNOSIS — H35373 Puckering of macula, bilateral: Secondary | ICD-10-CM | POA: Diagnosis not present

## 2022-08-23 DIAGNOSIS — H5203 Hypermetropia, bilateral: Secondary | ICD-10-CM | POA: Diagnosis not present

## 2022-08-23 DIAGNOSIS — H40013 Open angle with borderline findings, low risk, bilateral: Secondary | ICD-10-CM | POA: Diagnosis not present

## 2022-10-07 DIAGNOSIS — M25552 Pain in left hip: Secondary | ICD-10-CM | POA: Diagnosis not present

## 2022-10-09 DIAGNOSIS — M5432 Sciatica, left side: Secondary | ICD-10-CM | POA: Diagnosis not present

## 2022-10-09 DIAGNOSIS — Z6828 Body mass index (BMI) 28.0-28.9, adult: Secondary | ICD-10-CM | POA: Diagnosis not present

## 2022-10-09 DIAGNOSIS — Z9989 Dependence on other enabling machines and devices: Secondary | ICD-10-CM | POA: Diagnosis not present

## 2022-10-12 DIAGNOSIS — M2569 Stiffness of other specified joint, not elsewhere classified: Secondary | ICD-10-CM | POA: Diagnosis not present

## 2022-10-12 DIAGNOSIS — M5451 Vertebrogenic low back pain: Secondary | ICD-10-CM | POA: Diagnosis not present

## 2022-10-12 DIAGNOSIS — M5416 Radiculopathy, lumbar region: Secondary | ICD-10-CM | POA: Diagnosis not present

## 2022-10-12 DIAGNOSIS — M6281 Muscle weakness (generalized): Secondary | ICD-10-CM | POA: Diagnosis not present

## 2022-10-16 DIAGNOSIS — M5416 Radiculopathy, lumbar region: Secondary | ICD-10-CM | POA: Diagnosis not present

## 2022-10-16 DIAGNOSIS — M2569 Stiffness of other specified joint, not elsewhere classified: Secondary | ICD-10-CM | POA: Diagnosis not present

## 2022-10-16 DIAGNOSIS — M6281 Muscle weakness (generalized): Secondary | ICD-10-CM | POA: Diagnosis not present

## 2022-10-16 DIAGNOSIS — M5451 Vertebrogenic low back pain: Secondary | ICD-10-CM | POA: Diagnosis not present

## 2022-10-19 DIAGNOSIS — M5416 Radiculopathy, lumbar region: Secondary | ICD-10-CM | POA: Diagnosis not present

## 2022-10-19 DIAGNOSIS — M5451 Vertebrogenic low back pain: Secondary | ICD-10-CM | POA: Diagnosis not present

## 2022-10-19 DIAGNOSIS — M2569 Stiffness of other specified joint, not elsewhere classified: Secondary | ICD-10-CM | POA: Diagnosis not present

## 2022-10-19 DIAGNOSIS — M6281 Muscle weakness (generalized): Secondary | ICD-10-CM | POA: Diagnosis not present

## 2022-10-23 DIAGNOSIS — M5416 Radiculopathy, lumbar region: Secondary | ICD-10-CM | POA: Diagnosis not present

## 2022-10-23 DIAGNOSIS — M5451 Vertebrogenic low back pain: Secondary | ICD-10-CM | POA: Diagnosis not present

## 2022-10-23 DIAGNOSIS — M2569 Stiffness of other specified joint, not elsewhere classified: Secondary | ICD-10-CM | POA: Diagnosis not present

## 2022-10-23 DIAGNOSIS — M6281 Muscle weakness (generalized): Secondary | ICD-10-CM | POA: Diagnosis not present

## 2022-10-27 DIAGNOSIS — M5416 Radiculopathy, lumbar region: Secondary | ICD-10-CM | POA: Diagnosis not present

## 2022-10-27 DIAGNOSIS — M2569 Stiffness of other specified joint, not elsewhere classified: Secondary | ICD-10-CM | POA: Diagnosis not present

## 2022-10-27 DIAGNOSIS — M6281 Muscle weakness (generalized): Secondary | ICD-10-CM | POA: Diagnosis not present

## 2022-10-27 DIAGNOSIS — M5451 Vertebrogenic low back pain: Secondary | ICD-10-CM | POA: Diagnosis not present

## 2022-11-01 DIAGNOSIS — M6281 Muscle weakness (generalized): Secondary | ICD-10-CM | POA: Diagnosis not present

## 2022-11-01 DIAGNOSIS — M5451 Vertebrogenic low back pain: Secondary | ICD-10-CM | POA: Diagnosis not present

## 2022-11-01 DIAGNOSIS — M2569 Stiffness of other specified joint, not elsewhere classified: Secondary | ICD-10-CM | POA: Diagnosis not present

## 2022-11-01 DIAGNOSIS — M5416 Radiculopathy, lumbar region: Secondary | ICD-10-CM | POA: Diagnosis not present

## 2022-11-03 DIAGNOSIS — M5416 Radiculopathy, lumbar region: Secondary | ICD-10-CM | POA: Diagnosis not present

## 2022-11-03 DIAGNOSIS — M5451 Vertebrogenic low back pain: Secondary | ICD-10-CM | POA: Diagnosis not present

## 2022-11-03 DIAGNOSIS — M2569 Stiffness of other specified joint, not elsewhere classified: Secondary | ICD-10-CM | POA: Diagnosis not present

## 2022-11-03 DIAGNOSIS — M6281 Muscle weakness (generalized): Secondary | ICD-10-CM | POA: Diagnosis not present

## 2022-11-05 DIAGNOSIS — M2569 Stiffness of other specified joint, not elsewhere classified: Secondary | ICD-10-CM | POA: Diagnosis not present

## 2022-11-05 DIAGNOSIS — M6281 Muscle weakness (generalized): Secondary | ICD-10-CM | POA: Diagnosis not present

## 2022-11-05 DIAGNOSIS — M5451 Vertebrogenic low back pain: Secondary | ICD-10-CM | POA: Diagnosis not present

## 2022-11-05 DIAGNOSIS — M5416 Radiculopathy, lumbar region: Secondary | ICD-10-CM | POA: Diagnosis not present

## 2022-11-10 DIAGNOSIS — M6281 Muscle weakness (generalized): Secondary | ICD-10-CM | POA: Diagnosis not present

## 2022-11-10 DIAGNOSIS — M5451 Vertebrogenic low back pain: Secondary | ICD-10-CM | POA: Diagnosis not present

## 2022-11-10 DIAGNOSIS — M2569 Stiffness of other specified joint, not elsewhere classified: Secondary | ICD-10-CM | POA: Diagnosis not present

## 2022-11-10 DIAGNOSIS — M5416 Radiculopathy, lumbar region: Secondary | ICD-10-CM | POA: Diagnosis not present

## 2022-11-14 DIAGNOSIS — M5416 Radiculopathy, lumbar region: Secondary | ICD-10-CM | POA: Diagnosis not present

## 2022-11-14 DIAGNOSIS — M6281 Muscle weakness (generalized): Secondary | ICD-10-CM | POA: Diagnosis not present

## 2022-11-14 DIAGNOSIS — M2569 Stiffness of other specified joint, not elsewhere classified: Secondary | ICD-10-CM | POA: Diagnosis not present

## 2022-11-14 DIAGNOSIS — M5451 Vertebrogenic low back pain: Secondary | ICD-10-CM | POA: Diagnosis not present

## 2022-11-16 DIAGNOSIS — M5451 Vertebrogenic low back pain: Secondary | ICD-10-CM | POA: Diagnosis not present

## 2022-11-16 DIAGNOSIS — M5416 Radiculopathy, lumbar region: Secondary | ICD-10-CM | POA: Diagnosis not present

## 2022-11-16 DIAGNOSIS — M2569 Stiffness of other specified joint, not elsewhere classified: Secondary | ICD-10-CM | POA: Diagnosis not present

## 2022-11-16 DIAGNOSIS — M6281 Muscle weakness (generalized): Secondary | ICD-10-CM | POA: Diagnosis not present

## 2022-11-21 DIAGNOSIS — M2569 Stiffness of other specified joint, not elsewhere classified: Secondary | ICD-10-CM | POA: Diagnosis not present

## 2022-11-21 DIAGNOSIS — M6281 Muscle weakness (generalized): Secondary | ICD-10-CM | POA: Diagnosis not present

## 2022-11-21 DIAGNOSIS — M5451 Vertebrogenic low back pain: Secondary | ICD-10-CM | POA: Diagnosis not present

## 2022-11-21 DIAGNOSIS — M5416 Radiculopathy, lumbar region: Secondary | ICD-10-CM | POA: Diagnosis not present

## 2022-11-23 DIAGNOSIS — M5416 Radiculopathy, lumbar region: Secondary | ICD-10-CM | POA: Diagnosis not present

## 2022-11-23 DIAGNOSIS — M5451 Vertebrogenic low back pain: Secondary | ICD-10-CM | POA: Diagnosis not present

## 2022-11-23 DIAGNOSIS — M6281 Muscle weakness (generalized): Secondary | ICD-10-CM | POA: Diagnosis not present

## 2022-11-23 DIAGNOSIS — M2569 Stiffness of other specified joint, not elsewhere classified: Secondary | ICD-10-CM | POA: Diagnosis not present

## 2022-11-28 DIAGNOSIS — M6281 Muscle weakness (generalized): Secondary | ICD-10-CM | POA: Diagnosis not present

## 2022-11-28 DIAGNOSIS — M5416 Radiculopathy, lumbar region: Secondary | ICD-10-CM | POA: Diagnosis not present

## 2022-11-28 DIAGNOSIS — M5451 Vertebrogenic low back pain: Secondary | ICD-10-CM | POA: Diagnosis not present

## 2022-11-28 DIAGNOSIS — M2569 Stiffness of other specified joint, not elsewhere classified: Secondary | ICD-10-CM | POA: Diagnosis not present

## 2022-11-30 DIAGNOSIS — M6281 Muscle weakness (generalized): Secondary | ICD-10-CM | POA: Diagnosis not present

## 2022-11-30 DIAGNOSIS — M5416 Radiculopathy, lumbar region: Secondary | ICD-10-CM | POA: Diagnosis not present

## 2022-11-30 DIAGNOSIS — M2569 Stiffness of other specified joint, not elsewhere classified: Secondary | ICD-10-CM | POA: Diagnosis not present

## 2022-11-30 DIAGNOSIS — M5451 Vertebrogenic low back pain: Secondary | ICD-10-CM | POA: Diagnosis not present

## 2022-12-27 DIAGNOSIS — M810 Age-related osteoporosis without current pathological fracture: Secondary | ICD-10-CM | POA: Diagnosis not present

## 2022-12-27 DIAGNOSIS — Z8673 Personal history of transient ischemic attack (TIA), and cerebral infarction without residual deficits: Secondary | ICD-10-CM | POA: Diagnosis not present

## 2022-12-27 DIAGNOSIS — E7800 Pure hypercholesterolemia, unspecified: Secondary | ICD-10-CM | POA: Diagnosis not present

## 2022-12-27 DIAGNOSIS — N401 Enlarged prostate with lower urinary tract symptoms: Secondary | ICD-10-CM | POA: Diagnosis not present

## 2022-12-27 DIAGNOSIS — D485 Neoplasm of uncertain behavior of skin: Secondary | ICD-10-CM | POA: Diagnosis not present

## 2022-12-27 DIAGNOSIS — I10 Essential (primary) hypertension: Secondary | ICD-10-CM | POA: Diagnosis not present

## 2023-01-17 DIAGNOSIS — I493 Ventricular premature depolarization: Secondary | ICD-10-CM | POA: Diagnosis not present

## 2023-01-17 DIAGNOSIS — I499 Cardiac arrhythmia, unspecified: Secondary | ICD-10-CM | POA: Diagnosis not present

## 2023-01-17 DIAGNOSIS — Z6826 Body mass index (BMI) 26.0-26.9, adult: Secondary | ICD-10-CM | POA: Diagnosis not present

## 2023-03-01 DIAGNOSIS — B351 Tinea unguium: Secondary | ICD-10-CM | POA: Diagnosis not present

## 2023-03-01 DIAGNOSIS — B009 Herpesviral infection, unspecified: Secondary | ICD-10-CM | POA: Diagnosis not present

## 2023-03-01 DIAGNOSIS — D1801 Hemangioma of skin and subcutaneous tissue: Secondary | ICD-10-CM | POA: Diagnosis not present

## 2023-03-01 DIAGNOSIS — Z85828 Personal history of other malignant neoplasm of skin: Secondary | ICD-10-CM | POA: Diagnosis not present

## 2023-03-01 DIAGNOSIS — L814 Other melanin hyperpigmentation: Secondary | ICD-10-CM | POA: Diagnosis not present

## 2023-03-01 DIAGNOSIS — L821 Other seborrheic keratosis: Secondary | ICD-10-CM | POA: Diagnosis not present

## 2023-03-01 DIAGNOSIS — D3612 Benign neoplasm of peripheral nerves and autonomic nervous system, upper limb, including shoulder: Secondary | ICD-10-CM | POA: Diagnosis not present

## 2023-03-01 DIAGNOSIS — L570 Actinic keratosis: Secondary | ICD-10-CM | POA: Diagnosis not present

## 2023-03-01 DIAGNOSIS — I8311 Varicose veins of right lower extremity with inflammation: Secondary | ICD-10-CM | POA: Diagnosis not present

## 2023-03-01 DIAGNOSIS — I8312 Varicose veins of left lower extremity with inflammation: Secondary | ICD-10-CM | POA: Diagnosis not present

## 2023-03-01 DIAGNOSIS — I872 Venous insufficiency (chronic) (peripheral): Secondary | ICD-10-CM | POA: Diagnosis not present

## 2023-03-01 DIAGNOSIS — D225 Melanocytic nevi of trunk: Secondary | ICD-10-CM | POA: Diagnosis not present

## 2023-04-09 DIAGNOSIS — Z6826 Body mass index (BMI) 26.0-26.9, adult: Secondary | ICD-10-CM | POA: Diagnosis not present

## 2023-04-09 DIAGNOSIS — M67952 Unspecified disorder of synovium and tendon, left thigh: Secondary | ICD-10-CM | POA: Diagnosis not present

## 2023-04-10 DIAGNOSIS — M25552 Pain in left hip: Secondary | ICD-10-CM | POA: Diagnosis not present

## 2023-04-10 DIAGNOSIS — R262 Difficulty in walking, not elsewhere classified: Secondary | ICD-10-CM | POA: Diagnosis not present

## 2023-04-10 DIAGNOSIS — M25652 Stiffness of left hip, not elsewhere classified: Secondary | ICD-10-CM | POA: Diagnosis not present

## 2023-04-10 DIAGNOSIS — M6281 Muscle weakness (generalized): Secondary | ICD-10-CM | POA: Diagnosis not present

## 2023-04-12 DIAGNOSIS — M6281 Muscle weakness (generalized): Secondary | ICD-10-CM | POA: Diagnosis not present

## 2023-04-12 DIAGNOSIS — M25552 Pain in left hip: Secondary | ICD-10-CM | POA: Diagnosis not present

## 2023-04-12 DIAGNOSIS — R262 Difficulty in walking, not elsewhere classified: Secondary | ICD-10-CM | POA: Diagnosis not present

## 2023-04-12 DIAGNOSIS — M25652 Stiffness of left hip, not elsewhere classified: Secondary | ICD-10-CM | POA: Diagnosis not present

## 2023-04-18 DIAGNOSIS — R262 Difficulty in walking, not elsewhere classified: Secondary | ICD-10-CM | POA: Diagnosis not present

## 2023-04-18 DIAGNOSIS — M6281 Muscle weakness (generalized): Secondary | ICD-10-CM | POA: Diagnosis not present

## 2023-04-18 DIAGNOSIS — M25652 Stiffness of left hip, not elsewhere classified: Secondary | ICD-10-CM | POA: Diagnosis not present

## 2023-04-18 DIAGNOSIS — M25552 Pain in left hip: Secondary | ICD-10-CM | POA: Diagnosis not present

## 2023-04-20 DIAGNOSIS — R262 Difficulty in walking, not elsewhere classified: Secondary | ICD-10-CM | POA: Diagnosis not present

## 2023-04-20 DIAGNOSIS — M25652 Stiffness of left hip, not elsewhere classified: Secondary | ICD-10-CM | POA: Diagnosis not present

## 2023-04-20 DIAGNOSIS — M25552 Pain in left hip: Secondary | ICD-10-CM | POA: Diagnosis not present

## 2023-04-20 DIAGNOSIS — M6281 Muscle weakness (generalized): Secondary | ICD-10-CM | POA: Diagnosis not present

## 2023-04-24 DIAGNOSIS — M6281 Muscle weakness (generalized): Secondary | ICD-10-CM | POA: Diagnosis not present

## 2023-04-24 DIAGNOSIS — M25552 Pain in left hip: Secondary | ICD-10-CM | POA: Diagnosis not present

## 2023-04-24 DIAGNOSIS — R262 Difficulty in walking, not elsewhere classified: Secondary | ICD-10-CM | POA: Diagnosis not present

## 2023-04-24 DIAGNOSIS — M25652 Stiffness of left hip, not elsewhere classified: Secondary | ICD-10-CM | POA: Diagnosis not present

## 2023-04-26 DIAGNOSIS — M25552 Pain in left hip: Secondary | ICD-10-CM | POA: Diagnosis not present

## 2023-04-26 DIAGNOSIS — M25652 Stiffness of left hip, not elsewhere classified: Secondary | ICD-10-CM | POA: Diagnosis not present

## 2023-04-26 DIAGNOSIS — M6281 Muscle weakness (generalized): Secondary | ICD-10-CM | POA: Diagnosis not present

## 2023-04-26 DIAGNOSIS — R262 Difficulty in walking, not elsewhere classified: Secondary | ICD-10-CM | POA: Diagnosis not present

## 2023-05-01 DIAGNOSIS — M25552 Pain in left hip: Secondary | ICD-10-CM | POA: Diagnosis not present

## 2023-05-01 DIAGNOSIS — M25652 Stiffness of left hip, not elsewhere classified: Secondary | ICD-10-CM | POA: Diagnosis not present

## 2023-05-01 DIAGNOSIS — R262 Difficulty in walking, not elsewhere classified: Secondary | ICD-10-CM | POA: Diagnosis not present

## 2023-05-01 DIAGNOSIS — M6281 Muscle weakness (generalized): Secondary | ICD-10-CM | POA: Diagnosis not present

## 2023-05-03 DIAGNOSIS — M25552 Pain in left hip: Secondary | ICD-10-CM | POA: Diagnosis not present

## 2023-05-03 DIAGNOSIS — M25652 Stiffness of left hip, not elsewhere classified: Secondary | ICD-10-CM | POA: Diagnosis not present

## 2023-05-03 DIAGNOSIS — R262 Difficulty in walking, not elsewhere classified: Secondary | ICD-10-CM | POA: Diagnosis not present

## 2023-05-03 DIAGNOSIS — M6281 Muscle weakness (generalized): Secondary | ICD-10-CM | POA: Diagnosis not present

## 2023-05-07 DIAGNOSIS — M25652 Stiffness of left hip, not elsewhere classified: Secondary | ICD-10-CM | POA: Diagnosis not present

## 2023-05-07 DIAGNOSIS — M25552 Pain in left hip: Secondary | ICD-10-CM | POA: Diagnosis not present

## 2023-05-07 DIAGNOSIS — M6281 Muscle weakness (generalized): Secondary | ICD-10-CM | POA: Diagnosis not present

## 2023-05-07 DIAGNOSIS — R262 Difficulty in walking, not elsewhere classified: Secondary | ICD-10-CM | POA: Diagnosis not present

## 2023-07-18 DIAGNOSIS — Z6828 Body mass index (BMI) 28.0-28.9, adult: Secondary | ICD-10-CM | POA: Diagnosis not present

## 2023-07-18 DIAGNOSIS — Z1331 Encounter for screening for depression: Secondary | ICD-10-CM | POA: Diagnosis not present

## 2023-07-18 DIAGNOSIS — Z0000 Encounter for general adult medical examination without abnormal findings: Secondary | ICD-10-CM | POA: Diagnosis not present

## 2023-07-20 ENCOUNTER — Other Ambulatory Visit: Payer: Self-pay | Admitting: Family Medicine

## 2023-07-20 DIAGNOSIS — M810 Age-related osteoporosis without current pathological fracture: Secondary | ICD-10-CM

## 2023-08-28 DIAGNOSIS — H40023 Open angle with borderline findings, high risk, bilateral: Secondary | ICD-10-CM | POA: Diagnosis not present

## 2023-08-28 DIAGNOSIS — H35373 Puckering of macula, bilateral: Secondary | ICD-10-CM | POA: Diagnosis not present

## 2023-08-28 DIAGNOSIS — Z961 Presence of intraocular lens: Secondary | ICD-10-CM | POA: Diagnosis not present

## 2023-08-28 DIAGNOSIS — H52203 Unspecified astigmatism, bilateral: Secondary | ICD-10-CM | POA: Diagnosis not present

## 2023-12-26 DIAGNOSIS — E7800 Pure hypercholesterolemia, unspecified: Secondary | ICD-10-CM | POA: Diagnosis not present

## 2023-12-26 DIAGNOSIS — M810 Age-related osteoporosis without current pathological fracture: Secondary | ICD-10-CM | POA: Diagnosis not present

## 2023-12-26 DIAGNOSIS — N401 Enlarged prostate with lower urinary tract symptoms: Secondary | ICD-10-CM | POA: Diagnosis not present

## 2023-12-26 DIAGNOSIS — M25511 Pain in right shoulder: Secondary | ICD-10-CM | POA: Diagnosis not present

## 2023-12-26 DIAGNOSIS — E663 Overweight: Secondary | ICD-10-CM | POA: Diagnosis not present

## 2023-12-26 DIAGNOSIS — Z6827 Body mass index (BMI) 27.0-27.9, adult: Secondary | ICD-10-CM | POA: Diagnosis not present

## 2023-12-26 DIAGNOSIS — I10 Essential (primary) hypertension: Secondary | ICD-10-CM | POA: Diagnosis not present

## 2024-01-14 DIAGNOSIS — M25511 Pain in right shoulder: Secondary | ICD-10-CM | POA: Diagnosis not present

## 2024-01-24 DIAGNOSIS — M25511 Pain in right shoulder: Secondary | ICD-10-CM | POA: Diagnosis not present

## 2024-01-24 DIAGNOSIS — M6281 Muscle weakness (generalized): Secondary | ICD-10-CM | POA: Diagnosis not present

## 2024-01-24 DIAGNOSIS — M25611 Stiffness of right shoulder, not elsewhere classified: Secondary | ICD-10-CM | POA: Diagnosis not present

## 2024-01-24 DIAGNOSIS — M25411 Effusion, right shoulder: Secondary | ICD-10-CM | POA: Diagnosis not present

## 2024-01-30 DIAGNOSIS — M6281 Muscle weakness (generalized): Secondary | ICD-10-CM | POA: Diagnosis not present

## 2024-01-30 DIAGNOSIS — M25611 Stiffness of right shoulder, not elsewhere classified: Secondary | ICD-10-CM | POA: Diagnosis not present

## 2024-01-30 DIAGNOSIS — M25511 Pain in right shoulder: Secondary | ICD-10-CM | POA: Diagnosis not present

## 2024-01-30 DIAGNOSIS — M25411 Effusion, right shoulder: Secondary | ICD-10-CM | POA: Diagnosis not present

## 2024-01-31 DIAGNOSIS — M6281 Muscle weakness (generalized): Secondary | ICD-10-CM | POA: Diagnosis not present

## 2024-01-31 DIAGNOSIS — M25611 Stiffness of right shoulder, not elsewhere classified: Secondary | ICD-10-CM | POA: Diagnosis not present

## 2024-01-31 DIAGNOSIS — M25411 Effusion, right shoulder: Secondary | ICD-10-CM | POA: Diagnosis not present

## 2024-01-31 DIAGNOSIS — M25511 Pain in right shoulder: Secondary | ICD-10-CM | POA: Diagnosis not present

## 2024-02-06 DIAGNOSIS — M25511 Pain in right shoulder: Secondary | ICD-10-CM | POA: Diagnosis not present

## 2024-02-06 DIAGNOSIS — M6281 Muscle weakness (generalized): Secondary | ICD-10-CM | POA: Diagnosis not present

## 2024-02-06 DIAGNOSIS — M25411 Effusion, right shoulder: Secondary | ICD-10-CM | POA: Diagnosis not present

## 2024-02-06 DIAGNOSIS — M25611 Stiffness of right shoulder, not elsewhere classified: Secondary | ICD-10-CM | POA: Diagnosis not present

## 2024-02-07 DIAGNOSIS — M6281 Muscle weakness (generalized): Secondary | ICD-10-CM | POA: Diagnosis not present

## 2024-02-07 DIAGNOSIS — M25611 Stiffness of right shoulder, not elsewhere classified: Secondary | ICD-10-CM | POA: Diagnosis not present

## 2024-02-07 DIAGNOSIS — M25411 Effusion, right shoulder: Secondary | ICD-10-CM | POA: Diagnosis not present

## 2024-02-07 DIAGNOSIS — M25511 Pain in right shoulder: Secondary | ICD-10-CM | POA: Diagnosis not present

## 2024-02-12 DIAGNOSIS — M25411 Effusion, right shoulder: Secondary | ICD-10-CM | POA: Diagnosis not present

## 2024-02-12 DIAGNOSIS — M25611 Stiffness of right shoulder, not elsewhere classified: Secondary | ICD-10-CM | POA: Diagnosis not present

## 2024-02-12 DIAGNOSIS — M25511 Pain in right shoulder: Secondary | ICD-10-CM | POA: Diagnosis not present

## 2024-02-12 DIAGNOSIS — M6281 Muscle weakness (generalized): Secondary | ICD-10-CM | POA: Diagnosis not present

## 2024-02-14 DIAGNOSIS — M25411 Effusion, right shoulder: Secondary | ICD-10-CM | POA: Diagnosis not present

## 2024-02-14 DIAGNOSIS — M25611 Stiffness of right shoulder, not elsewhere classified: Secondary | ICD-10-CM | POA: Diagnosis not present

## 2024-02-14 DIAGNOSIS — M25511 Pain in right shoulder: Secondary | ICD-10-CM | POA: Diagnosis not present

## 2024-02-14 DIAGNOSIS — M6281 Muscle weakness (generalized): Secondary | ICD-10-CM | POA: Diagnosis not present

## 2024-02-19 DIAGNOSIS — M25611 Stiffness of right shoulder, not elsewhere classified: Secondary | ICD-10-CM | POA: Diagnosis not present

## 2024-02-19 DIAGNOSIS — M6281 Muscle weakness (generalized): Secondary | ICD-10-CM | POA: Diagnosis not present

## 2024-02-19 DIAGNOSIS — M25411 Effusion, right shoulder: Secondary | ICD-10-CM | POA: Diagnosis not present

## 2024-02-21 DIAGNOSIS — M25611 Stiffness of right shoulder, not elsewhere classified: Secondary | ICD-10-CM | POA: Diagnosis not present

## 2024-02-21 DIAGNOSIS — M25511 Pain in right shoulder: Secondary | ICD-10-CM | POA: Diagnosis not present

## 2024-02-21 DIAGNOSIS — M6281 Muscle weakness (generalized): Secondary | ICD-10-CM | POA: Diagnosis not present

## 2024-02-21 DIAGNOSIS — M25411 Effusion, right shoulder: Secondary | ICD-10-CM | POA: Diagnosis not present

## 2024-02-27 ENCOUNTER — Other Ambulatory Visit (HOSPITAL_BASED_OUTPATIENT_CLINIC_OR_DEPARTMENT_OTHER): Payer: Self-pay | Admitting: Family Medicine

## 2024-02-27 DIAGNOSIS — M810 Age-related osteoporosis without current pathological fracture: Secondary | ICD-10-CM

## 2024-03-12 DIAGNOSIS — M25511 Pain in right shoulder: Secondary | ICD-10-CM | POA: Diagnosis not present
